# Patient Record
Sex: Female | Born: 1965
Health system: Southern US, Community
[De-identification: ages and names within clinical notes are randomized; demographics above are authoritative.]

## PROBLEM LIST (undated history)

## (undated) DIAGNOSIS — H9313 Tinnitus, bilateral: Secondary | ICD-10-CM

## (undated) DIAGNOSIS — R931 Abnormal findings on diagnostic imaging of heart and coronary circulation: Secondary | ICD-10-CM

## (undated) DIAGNOSIS — E785 Hyperlipidemia, unspecified: Secondary | ICD-10-CM

## (undated) DIAGNOSIS — G609 Hereditary and idiopathic neuropathy, unspecified: Secondary | ICD-10-CM

## (undated) HISTORY — DX: Hyperlipidemia, unspecified: E78.5

## (undated) HISTORY — DX: Tinnitus, bilateral: H93.13

## (undated) HISTORY — DX: Abnormal findings on diagnostic imaging of heart and coronary circulation: R93.1

## (undated) HISTORY — DX: Hereditary and idiopathic neuropathy, unspecified: G60.9

---

## 2000-11-17 ENCOUNTER — Encounter: Payer: Self-pay | Admitting: Family Medicine

## 2000-11-17 ENCOUNTER — Ambulatory Visit (HOSPITAL_COMMUNITY): Admission: RE | Admit: 2000-11-17 | Discharge: 2000-11-17 | Payer: Self-pay | Admitting: Family Medicine

## 2001-08-12 ENCOUNTER — Encounter: Payer: Self-pay | Admitting: Family Medicine

## 2001-08-12 ENCOUNTER — Ambulatory Visit (HOSPITAL_COMMUNITY): Admission: RE | Admit: 2001-08-12 | Discharge: 2001-08-12 | Payer: Self-pay | Admitting: Family Medicine

## 2013-07-12 ENCOUNTER — Telehealth: Payer: Self-pay | Admitting: Nurse Practitioner

## 2013-07-12 DIAGNOSIS — R5381 Other malaise: Secondary | ICD-10-CM

## 2013-07-12 DIAGNOSIS — Z79899 Other long term (current) drug therapy: Secondary | ICD-10-CM

## 2013-07-12 DIAGNOSIS — R5383 Other fatigue: Secondary | ICD-10-CM

## 2013-07-12 DIAGNOSIS — Z0189 Encounter for other specified special examinations: Secondary | ICD-10-CM

## 2013-07-12 NOTE — Telephone Encounter (Signed)
Pt has a PE with pap for 6/30   Can we order BW papers for this please

## 2013-07-12 NOTE — Telephone Encounter (Signed)
Has not been seen in [facility]

## 2013-07-13 NOTE — Telephone Encounter (Signed)
This pts appt is with Eber Mcgroarty, does she need to add BW to this order?  Original note sent as Eber Krus for provider

## 2013-07-13 NOTE — Telephone Encounter (Signed)
Left message notifying patient bloodwork orders are ready.

## 2013-07-13 NOTE — Telephone Encounter (Signed)
CBC, lipid, metabolic 7

## 2013-08-02 ENCOUNTER — Encounter: Payer: Self-pay | Admitting: Nurse Practitioner

## 2013-08-02 ENCOUNTER — Ambulatory Visit (INDEPENDENT_AMBULATORY_CARE_PROVIDER_SITE_OTHER): Payer: BC Managed Care – PPO | Admitting: Nurse Practitioner

## 2013-08-02 VITALS — BP 108/64 | Ht 68.5 in | Wt 132.4 lb

## 2013-08-02 DIAGNOSIS — Z139 Encounter for screening, unspecified: Secondary | ICD-10-CM

## 2013-08-02 DIAGNOSIS — Z1239 Encounter for other screening for malignant neoplasm of breast: Secondary | ICD-10-CM

## 2013-08-02 DIAGNOSIS — Z23 Encounter for immunization: Secondary | ICD-10-CM

## 2013-08-02 DIAGNOSIS — Z0000 Encounter for general adult medical examination without abnormal findings: Secondary | ICD-10-CM

## 2013-08-06 ENCOUNTER — Encounter: Payer: Self-pay | Admitting: Nurse Practitioner

## 2013-08-06 NOTE — Progress Notes (Signed)
   Subjective:    Patient ID: Olivia Johnson, female    DOB: February 23, 1965, 48 y.o.   MRN: [id number]  HPI presents for her wellness checkup. Same sexual partner. Regular menstrual cycles, light flow lasting 5-6 days. Regular vision and dental exams. Regular walking program. Healthy diet. Is due for her routine labs, requesting testing for Addison's disease, her daughter has been diagnosed with this. Patient is currently asymptomatic.    Review of Systems  Constitutional: Negative for fever, activity change, appetite change, fatigue and unexpected weight change.  HENT: Negative for dental problem, ear pain, sinus pressure, sore throat and trouble swallowing.   Respiratory: Negative for cough, chest tightness, shortness of breath and wheezing.   Cardiovascular: Negative for chest pain and leg swelling.  Gastrointestinal: Negative for nausea, vomiting, abdominal pain, diarrhea, constipation, blood in stool and abdominal distention.  Genitourinary: Negative for dysuria, urgency, frequency, vaginal discharge, enuresis, difficulty urinating, genital sores, menstrual problem and pelvic pain.       Objective:   Physical Exam  Vitals reviewed. Constitutional: She is oriented to person, place, and time. She appears well-developed. No distress.  HENT:  Right Ear: External ear normal.  Left Ear: External ear normal.  Mouth/Throat: Oropharynx is clear and moist.  Neck: Normal range of motion. Neck supple. No tracheal deviation present. No thyromegaly present.  Cardiovascular: Normal rate, regular rhythm and normal heart sounds.  Exam reveals no gallop.   No murmur heard. Pulmonary/Chest: Effort normal and breath sounds normal.  Abdominal: Soft. She exhibits no distension. There is no tenderness.  Genitourinary: Vagina normal and uterus normal. No vaginal discharge found.  External GU: No rashes or lesions. Vagina no discharge. Bimanual exam no masses or tenderness noted.  Musculoskeletal: She  exhibits no edema.  Lymphadenopathy:    She has no cervical adenopathy.  Neurological: She is alert and oriented to person, place, and time.  Skin: Skin is warm and dry. No rash noted.  Psychiatric: She has a normal mood and affect. Her behavior is normal.   Breast exam: Slightly dense tissue, no masses, axilla no adenopathy.        Assessment & Plan:  Routine general medical examination at a health care facility - Plan: Hepatic function panel, TSH, Vit D  25 hydroxy (rtn osteoporosis monitoring)  Encounter for special screening examination - Plan: ACTH, Cortisol  Breast screening, unspecified - Plan: MM Digital Screening  Need for prophylactic vaccination with combined diphtheria-tetanus-pertussis (DTP) vaccine - Plan: Tdap vaccine greater than or equal to 7yo IM  Given information for colonoscopy. Next physical in one year.

## 2013-08-10 ENCOUNTER — Ambulatory Visit (HOSPITAL_COMMUNITY)
Admission: RE | Admit: 2013-08-10 | Discharge: 2013-08-10 | Disposition: A | Discharge status: Home / Self Care | Payer: BC Managed Care – PPO | Source: Ambulatory Visit | Attending: Nurse Practitioner | Admitting: Nurse Practitioner

## 2013-08-10 DIAGNOSIS — Z1231 Encounter for screening mammogram for malignant neoplasm of breast: Secondary | ICD-10-CM | POA: Insufficient documentation

## 2014-01-17 ENCOUNTER — Encounter (HOSPITAL_COMMUNITY): Payer: Self-pay | Admitting: Emergency Medicine

## 2014-01-17 ENCOUNTER — Emergency Department (HOSPITAL_COMMUNITY): Payer: Managed Care, Other (non HMO)

## 2014-01-17 ENCOUNTER — Emergency Department (HOSPITAL_COMMUNITY)
Admission: EM | Admit: 2014-01-17 | Discharge: 2014-01-17 | Disposition: A | Discharge status: Home / Self Care | Payer: Managed Care, Other (non HMO) | Attending: Emergency Medicine | Admitting: Emergency Medicine

## 2014-01-17 DIAGNOSIS — Y9389 Activity, other specified: Secondary | ICD-10-CM | POA: Diagnosis not present

## 2014-01-17 DIAGNOSIS — R0602 Shortness of breath: Secondary | ICD-10-CM | POA: Insufficient documentation

## 2014-01-17 DIAGNOSIS — Y998 Other external cause status: Secondary | ICD-10-CM | POA: Diagnosis not present

## 2014-01-17 DIAGNOSIS — W010XXA Fall on same level from slipping, tripping and stumbling without subsequent striking against object, initial encounter: Secondary | ICD-10-CM | POA: Insufficient documentation

## 2014-01-17 DIAGNOSIS — S299XXA Unspecified injury of thorax, initial encounter: Secondary | ICD-10-CM | POA: Diagnosis present

## 2014-01-17 DIAGNOSIS — Z79899 Other long term (current) drug therapy: Secondary | ICD-10-CM | POA: Diagnosis not present

## 2014-01-17 DIAGNOSIS — S2241XA Multiple fractures of ribs, right side, initial encounter for closed fracture: Secondary | ICD-10-CM | POA: Insufficient documentation

## 2014-01-17 DIAGNOSIS — S50311A Abrasion of right elbow, initial encounter: Secondary | ICD-10-CM

## 2014-01-17 DIAGNOSIS — Y92192 Bathroom in other specified residential institution as the place of occurrence of the external cause: Secondary | ICD-10-CM | POA: Diagnosis not present

## 2014-01-17 DIAGNOSIS — W19XXXA Unspecified fall, initial encounter: Secondary | ICD-10-CM

## 2014-01-17 DIAGNOSIS — Z791 Long term (current) use of non-steroidal anti-inflammatories (NSAID): Secondary | ICD-10-CM | POA: Diagnosis not present

## 2014-01-17 MED ORDER — ONDANSETRON 4 MG PO TBDP
4.0000 mg | ORAL_TABLET | Freq: Once | ORAL | Status: AC
  Administered 2014-01-17: 4 mg via ORAL

## 2014-01-17 MED ORDER — ONDANSETRON 4 MG PO TBDP
ORAL_TABLET | ORAL | Status: AC
  Filled 2014-01-17: qty 1

## 2014-01-17 MED ORDER — BACITRACIN ZINC 500 UNIT/GM EX OINT
TOPICAL_OINTMENT | CUTANEOUS | Status: AC
  Filled 2014-01-17: qty 0.9

## 2014-01-17 MED ORDER — FENTANYL CITRATE 0.05 MG/ML IJ SOLN
INTRAMUSCULAR | Status: AC
  Administered 2014-01-17: 100 ug via INTRAVENOUS
  Filled 2014-01-17: qty 2

## 2014-01-17 MED ORDER — ONDANSETRON 4 MG PO TBDP: 4.0000 mg | ORAL_TABLET | Freq: Three times a day (TID) | ORAL | Status: DC | PRN

## 2014-01-17 MED ORDER — FENTANYL CITRATE 0.05 MG/ML IJ SOLN
100.0000 ug | Freq: Once | INTRAMUSCULAR | Status: AC
  Administered 2014-01-17: 100 ug via INTRAVENOUS

## 2014-01-17 MED ORDER — NAPROXEN 500 MG PO TABS
500.0000 mg | ORAL_TABLET | Freq: Two times a day (BID) | ORAL | Status: DC
Start: 2014-01-17 — End: 2014-02-14

## 2014-01-17 MED ORDER — OXYCODONE-ACETAMINOPHEN 5-325 MG PO TABS: 1.0000 | ORAL_TABLET | ORAL | Status: DC | PRN

## 2014-01-17 NOTE — ED Notes (Signed)
Pt states her pain is ok is she is not moving.  Incentive Spirometer given and discussed use.  Pt was able to use IS and pull 1100

## 2014-01-17 NOTE — Discharge Instructions (Signed)
Please call your doctor for a followup appointment within 24-48 hours. When you talk to your doctor please let them know that you were seen in the emergency department and have them acquire all of your records so that they can discuss the findings with you and formulate a treatment plan to fully care for your new and ongoing problems. ° °

## 2014-01-17 NOTE — ED Notes (Signed)
Pt feel from standing position on bathroom since to floor hitting right ribcage on bathtub. Pt c/o pain to right ribcage and sob. Denies neck/back, loc.

## 2014-01-17 NOTE — ED Notes (Signed)
Pt c/o nausea when she went to get up.  Orders received from Dr. Hyacinth MeekerMiller and gave prescription for zofran.

## 2014-01-17 NOTE — ED Provider Notes (Signed)
CSN: [id number]     Arrival date & time 01/17/14  1153 History   First MD Initiated Contact with Patient 01/17/14 1155     Chief Complaint  Patient presents with  . Shortness of Breath     (Consider location/radiation/quality/duration/timing/severity/associated sxs/prior Treatment) HPI  The patient is a 48 year old female, she has no significant past medical history, she had a fall off of a countertop while she was trying to change a light fixture, this occurred just prior to arrival, the symptoms were acute in onset when she struck her right anterolateral lower rib cage on the edge of a bathtub. There was no loss of consciousness, there was acute onset of pain with some difficulty breathing, she denies abdominal pain nausea or vomiting. She was given morphine in route with some improvement  Pain was acute in onset, persistent, severe, associated with shortness of breath, worsened with deep breathing and palpation of the right chest wall  History reviewed. No pertinent past medical history. History reviewed. No pertinent past surgical history. No family history on file. History  Substance Use Topics  . Smoking status: Never Smoker   . Smokeless tobacco: Not on file  . Alcohol Use: Yes     Comment: occasional   OB History    No data available     Review of Systems  Respiratory: Positive for shortness of breath.   Cardiovascular: Positive for chest pain.      Allergies  Review of patient's allergies indicates no known allergies.  Home Medications   Prior to Admission medications   Medication Sig Start Date End Date Taking? Authorizing Provider  5-Hydroxytryptophan (5-HTP PO) Take by mouth daily.    Historical Provider, MD  naproxen (NAPROSYN) 500 MG tablet Take 1 tablet (500 mg total) by mouth 2 (two) times daily with a meal. 01/17/14   Vida RollerBrian D Jensen Kilburg, MD  oxyCODONE-acetaminophen (PERCOCET) 5-325 MG per tablet Take 1 tablet by mouth every 4 (four) hours as needed.  01/17/14   Vida RollerBrian D Laylani Pudwill, MD   BP 113/79 mmHg  Pulse 67  Temp(Src) 98 F (36.7 C) (Oral)  Resp 16  Ht 5\' 7"  (1.702 m)  Wt 130 lb (58.968 kg)  BMI 20.36 kg/m2  SpO2 100%  LMP 01/16/2014 Physical Exam  Constitutional: She appears well-developed and well-nourished. No distress.  HENT:  Head: Normocephalic and atraumatic.  Mouth/Throat: Oropharynx is clear and moist. No oropharyngeal exudate.  Eyes: Conjunctivae and EOM are normal. Pupils are equal, round, and reactive to light. Right eye exhibits no discharge. Left eye exhibits no discharge. No scleral icterus.  Neck: Normal range of motion. Neck supple. No JVD present. No thyromegaly present.  Cardiovascular: Normal rate, regular rhythm, normal heart sounds and intact distal pulses.  Exam reveals no gallop and no friction rub.   No murmur heard. Pulmonary/Chest: Effort normal and breath sounds normal. No respiratory distress. She has no wheezes. She has no rales. She exhibits tenderness.  The patient has no respiratory distress, normal oxygenation but is resistant to taking a deep breath secondary to pain  Abdominal: Soft. Bowel sounds are normal. She exhibits no distension and no mass. There is no tenderness.  No abdominal tenderness, no right upper quadrant tenderness  Musculoskeletal: Normal range of motion. She exhibits no edema or tenderness.  Lymphadenopathy:    She has no cervical adenopathy.  Neurological: She is alert. Coordination normal.  Skin: Skin is warm and dry. No rash noted. No erythema.  Psychiatric: She has a normal mood and  affect. Her behavior is normal.  Nursing note and vitals reviewed.   ED Course  Procedures (including critical care time) Labs Review Labs Reviewed - No data to display  Imaging Review Dg Ribs Unilateral W/chest Right  01/17/2014   CLINICAL DATA:  Fall from standing position hitting ribcage on bathtub, initial encounter  EXAM: RIGHT RIBS AND CHEST - 3+ VIEW  COMPARISON:  None.   FINDINGS: Cardiac shadow is within normal limits. The lungs are well-aerated without focal infiltrate or pneumothorax. There are fractures of the sixth, seventh, eighth and ninth ribs on the right posterior laterally. The seventh and eighth ribs show 1 bone width displacement. No pneumothorax is noted. No other focal abnormality is seen.  IMPRESSION: Multiple rib fractures on the right as described.   Electronically Signed   By: Alcide CleverMark  Lukens M.D.   On: 01/17/2014 12:54      MDM   Final diagnoses:  Fracture of ribs, multiple, closed, right, initial encounter  Abrasion of right elbow, initial encounter    Bedside FAST exam reveals no signs of free intraperitoneal fluid, x-ray ordered to rule out fracture, incentive spirometer ordered, pain medication given including 100 g of fentanyl.  Ultrasound limited abdominal and limited transthoracic ultrasound (FAST)  Indication: Trauma Four views were obtained using the low frequency transducer: Splenorenal, Hepatorenal, Retrovesical, Pericardial subxyphoid Interpretation: No  free fluid visualized surrounding the kidneys, pelvis or pericardium. Images archived electronically Dr. Hyacinth MeekerMiller personally performed and interpreted the images  X-rays confirm for broken ribs, no pneumothorax, no hypoxia, incentive spirometer given, patient stable for discharge on the following medications, fentanyl given here with good improvement, Percocet prescribed, patient informed on how to use the incentive spirometer and the indications for return. She expresses her understanding.  Meds given in ED:  Medications  fentaNYL (SUBLIMAZE) injection 100 mcg (100 mcg Intravenous Given 01/17/14 1221)    New Prescriptions   NAPROXEN (NAPROSYN) 500 MG TABLET    Take 1 tablet (500 mg total) by mouth 2 (two) times daily with a meal.   OXYCODONE-ACETAMINOPHEN (PERCOCET) 5-325 MG PER TABLET    Take 1 tablet by mouth every 4 (four) hours as needed.       Vida RollerBrian D Jacelyn Cuen,  MD 01/17/14 [id number]

## 2014-01-18 ENCOUNTER — Ambulatory Visit (HOSPITAL_COMMUNITY)
Admission: RE | Admit: 2014-01-18 | Discharge: 2014-01-18 | Disposition: A | Discharge status: Home / Self Care | Payer: Managed Care, Other (non HMO) | Source: Ambulatory Visit | Attending: Family Medicine | Admitting: Family Medicine

## 2014-01-18 ENCOUNTER — Ambulatory Visit (INDEPENDENT_AMBULATORY_CARE_PROVIDER_SITE_OTHER): Payer: Managed Care, Other (non HMO) | Admitting: Family Medicine

## 2014-01-18 ENCOUNTER — Telehealth: Payer: Self-pay | Admitting: Family Medicine

## 2014-01-18 VITALS — Temp 98.2°F

## 2014-01-18 DIAGNOSIS — J90 Pleural effusion, not elsewhere classified: Secondary | ICD-10-CM | POA: Insufficient documentation

## 2014-01-18 DIAGNOSIS — R319 Hematuria, unspecified: Secondary | ICD-10-CM

## 2014-01-18 DIAGNOSIS — W1789XA Other fall from one level to another, initial encounter: Secondary | ICD-10-CM | POA: Diagnosis not present

## 2014-01-18 DIAGNOSIS — T797XXA Traumatic subcutaneous emphysema, initial encounter: Secondary | ICD-10-CM | POA: Insufficient documentation

## 2014-01-18 DIAGNOSIS — S2241XA Multiple fractures of ribs, right side, initial encounter for closed fracture: Secondary | ICD-10-CM | POA: Diagnosis not present

## 2014-01-18 DIAGNOSIS — J9811 Atelectasis: Secondary | ICD-10-CM | POA: Insufficient documentation

## 2014-01-18 DIAGNOSIS — Y9289 Other specified places as the place of occurrence of the external cause: Secondary | ICD-10-CM | POA: Diagnosis not present

## 2014-01-18 DIAGNOSIS — R109 Unspecified abdominal pain: Secondary | ICD-10-CM

## 2014-01-18 DIAGNOSIS — R188 Other ascites: Secondary | ICD-10-CM | POA: Insufficient documentation

## 2014-01-18 DIAGNOSIS — S2231XS Fracture of one rib, right side, sequela: Secondary | ICD-10-CM

## 2014-01-18 LAB — BASIC METABOLIC PANEL
BUN: 9 mg/dL (ref 6–23)
CHLORIDE: 92 meq/L — AB (ref 96–112)
CO2: 26 mEq/L (ref 19–32)
Calcium: 9 mg/dL (ref 8.4–10.5)
Creat: 0.7 mg/dL (ref 0.50–1.10)
GLUCOSE: 115 mg/dL — AB (ref 70–99)
Potassium: 4.6 mEq/L (ref 3.5–5.3)
Sodium: 130 mEq/L — ABNORMAL LOW (ref 135–145)

## 2014-01-18 LAB — CBC WITH DIFFERENTIAL/PLATELET
BASOS ABS: 0 10*3/uL (ref 0.0–0.1)
Basophils Relative: 0 % (ref 0–1)
EOS ABS: 0 10*3/uL (ref 0.0–0.7)
Eosinophils Relative: 0 % (ref 0–5)
HEMATOCRIT: 41.1 % (ref 36.0–46.0)
HEMOGLOBIN: 13 g/dL (ref 12.0–15.0)
LYMPHS ABS: 0.8 10*3/uL (ref 0.7–4.0)
Lymphocytes Relative: 5 % — ABNORMAL LOW (ref 12–46)
MCH: 29.5 pg (ref 26.0–34.0)
MCHC: 31.6 g/dL (ref 30.0–36.0)
MCV: 93.4 fL (ref 78.0–100.0)
MPV: 10.3 fL (ref 9.4–12.4)
Monocytes Absolute: 1 10*3/uL (ref 0.1–1.0)
Monocytes Relative: 6 % (ref 3–12)
NEUTROS ABS: 14.9 10*3/uL — AB (ref 1.7–7.7)
NEUTROS PCT: 89 % — AB (ref 43–77)
Platelets: 300 10*3/uL (ref 150–400)
RBC: 4.4 MIL/uL (ref 3.87–5.11)
RDW: 13.7 % (ref 11.5–15.5)
WBC: 16.7 10*3/uL — ABNORMAL HIGH (ref 4.0–10.5)

## 2014-01-18 MED ORDER — OXYCODONE-ACETAMINOPHEN 5-325 MG PO TABS: 1.0000 | ORAL_TABLET | ORAL | Status: DC | PRN

## 2014-01-18 MED ORDER — IOHEXOL 300 MG/ML  SOLN
100.0000 mL | Freq: Once | INTRAMUSCULAR | Status: AC | PRN
  Administered 2014-01-18: 100 mL via INTRAVENOUS

## 2014-01-18 NOTE — Telephone Encounter (Signed)
Pt went to the ER yesterday, broke 4 ribs. Dr Hyacinth MeekerMiller prescribed percocet 1 every 4 hrs. Pt needs refill on this med. Pt will run out on Sat.

## 2014-01-18 NOTE — Progress Notes (Signed)
   Subjective:    Patient ID: Olivia KinJudith S Johnson, female    DOB: 05-31-1965, 48 y.o.   MRN: [id number]  HPI Patient was talked to at length she was on a countertop in the bathroom slipped fell backwards striking her ribs on a counter edge cause severe pain discomfort in addition to that went to the ER they did x-ray showed fractured ribs no pneumothorax after that she took oxycodone for pain she has had some constipation in addition to that she also relates hematuria that started today. ER notes and x-rays reviewed as well. 25 minutes spent with patient  Review of Systems     Objective:   Physical Exam This patient has extensive soreness in her right lower ribs. Lungs are clear hearts regular pulse normal  Her urinalysis shows RBCs and blood, gross hematuria as well although more a day hemosiderin appearance     Assessment & Plan:  Rib fractures-oxycodone every 4 hours when necessary severe pain caution drowsiness try to back off on use as pain improves  Constipation MiraLAX twice daily stool softeners and suppositories as needed  Significant hematuria-in the face of this fracture it is raises serious questions about the possibility of a kidney hematoma or even the possibility of damage to the collecting doubt this patient needs urgent/emergent CT scan with contrast. Radiologist specialist spoke with and agreed upon the testing. Lab work ordered as well. Await the results of these testing.

## 2014-01-18 NOTE — Telephone Encounter (Signed)
Discussed with patient. Patient scheduled follow up office visit for tomm

## 2014-01-18 NOTE — Telephone Encounter (Signed)
Patient got #30 percocet yesterday and states she is taking 6 a day and will be out Sat

## 2014-01-18 NOTE — Telephone Encounter (Signed)
Certainly I am sympathetic. I will need to see her in follow-up. She may be scheduled for tomorrow. We will recheck to see how she is doing. This also establishes us with care of this fractured ribs and allows us to go forward with prescribing her pain medicine.

## 2014-01-19 ENCOUNTER — Ambulatory Visit: Payer: BC Managed Care – PPO | Admitting: Family Medicine

## 2014-01-19 LAB — HEPATIC FUNCTION PANEL
ALT: 13 U/L (ref 0–35)
AST: 26 U/L (ref 0–37)
Albumin: 4.3 g/dL (ref 3.5–5.2)
Alkaline Phosphatase: 49 U/L (ref 39–117)
Bilirubin, Direct: 0.2 mg/dL (ref 0.0–0.3)
Indirect Bilirubin: 0.6 mg/dL (ref 0.2–1.2)
Total Bilirubin: 0.8 mg/dL (ref 0.2–1.2)
Total Protein: 6.7 g/dL (ref 6.0–8.3)

## 2014-01-19 LAB — VITAMIN D 25 HYDROXY (VIT D DEFICIENCY, FRACTURES): Vit D, 25-Hydroxy: 23 ng/mL — ABNORMAL LOW (ref 30–100)

## 2014-01-19 LAB — CORTISOL: Cortisol, Plasma: 24.9 ug/dL

## 2014-01-19 LAB — TSH: TSH: 3.515 u[IU]/mL (ref 0.350–4.500)

## 2014-01-20 LAB — ACTH: C206 ACTH: 7 pg/mL (ref 6–50)

## 2014-01-25 LAB — CBC WITH DIFFERENTIAL/PLATELET
Basophils Absolute: 0.1 10*3/uL (ref 0.0–0.1)
Basophils Relative: 1 % (ref 0–1)
EOS ABS: 0.3 10*3/uL (ref 0.0–0.7)
Eosinophils Relative: 3 % (ref 0–5)
HCT: 36.1 % (ref 36.0–46.0)
HEMOGLOBIN: 11.9 g/dL — AB (ref 12.0–15.0)
Lymphocytes Relative: 25 % (ref 12–46)
Lymphs Abs: 2.1 10*3/uL (ref 0.7–4.0)
MCH: 28.9 pg (ref 26.0–34.0)
MCHC: 33 g/dL (ref 30.0–36.0)
MCV: 87.6 fL (ref 78.0–100.0)
MONO ABS: 0.8 10*3/uL (ref 0.1–1.0)
MPV: 9.2 fL — AB (ref 9.4–12.4)
Monocytes Relative: 9 % (ref 3–12)
Neutro Abs: 5.2 10*3/uL (ref 1.7–7.7)
Neutrophils Relative %: 62 % (ref 43–77)
PLATELETS: 417 10*3/uL — AB (ref 150–400)
RBC: 4.12 MIL/uL (ref 3.87–5.11)
RDW: 13.4 % (ref 11.5–15.5)
WBC: 8.4 10*3/uL (ref 4.0–10.5)

## 2014-01-25 LAB — BASIC METABOLIC PANEL
BUN: 15 mg/dL (ref 6–23)
CALCIUM: 9 mg/dL (ref 8.4–10.5)
CO2: 29 meq/L (ref 19–32)
Chloride: 100 mEq/L (ref 96–112)
Creat: 0.75 mg/dL (ref 0.50–1.10)
GLUCOSE: 95 mg/dL (ref 70–99)
Potassium: 4.4 mEq/L (ref 3.5–5.3)
Sodium: 137 mEq/L (ref 135–145)

## 2014-02-06 ENCOUNTER — Encounter: Payer: Self-pay | Admitting: Family Medicine

## 2014-02-06 ENCOUNTER — Ambulatory Visit (INDEPENDENT_AMBULATORY_CARE_PROVIDER_SITE_OTHER): Payer: Managed Care, Other (non HMO) | Admitting: Family Medicine

## 2014-02-06 VITALS — BP 110/74 | Ht 68.5 in | Wt 135.1 lb

## 2014-02-06 DIAGNOSIS — S2231XS Fracture of one rib, right side, sequela: Secondary | ICD-10-CM

## 2014-02-06 MED ORDER — OXYCODONE-ACETAMINOPHEN 5-325 MG PO TABS: 1.0000 | ORAL_TABLET | ORAL | Status: DC | PRN

## 2014-02-06 NOTE — Progress Notes (Signed)
   Subjective:    Patient ID: Olivia Johnson, female    DOB: 09/24/1965, 49 y.o.   MRN: [id number]  HPI Patient is here today for a follow up visit on her rib fracture. Patient states her symptoms are much better. Currently taking Naproxen and Percocet as needed. Patient states that she has a bruised lung and still experiencing SOB and fatigue due to this. Patient also has a knot still present on her right elbow.   Patient relates she is able to do more than what she used to and she relates a little bit of shortness of breath with activity although she states is actually getting much better compared where was a few weeks back when she first fractured. Review of Systems Patient relates right side pain discomfort with deep breath and with movement of her arm especially above the shoulder    Objective:   Physical Exam Neck no masses right collarbone slightly more prominent than the left but no tenderness probable calcium deposit near the right elbow on her radius Bruising noted on the right side, lungs are clear heart regular       Assessment & Plan:  Rib fracture-use over-the-counter measures during the day to help with inflammation and discomfort gentle range of motion should gradually get better over the next 6-8 weeks often can take 8-12 weeks to get a total resolution oxycodone for severe pain only nighttime use only  I do not find evidence of pneumonia I find no evidence of lung pulmonary embolus. Certainly if the patient worsens over the next few weeks she is notify us and we will do additional imaging

## 2014-02-08 ENCOUNTER — Telehealth: Payer: Self-pay | Admitting: *Deleted

## 2014-02-08 ENCOUNTER — Other Ambulatory Visit: Payer: Self-pay | Admitting: *Deleted

## 2014-02-08 MED ORDER — HYDROCODONE-ACETAMINOPHEN 5-325 MG PO TABS: 1.0000 | ORAL_TABLET | ORAL | Status: DC | PRN

## 2014-02-08 NOTE — Telephone Encounter (Signed)
Discussed with pt. Script ready up front for pickup.

## 2014-02-08 NOTE — Progress Notes (Signed)
Patient notified and verbalized understanding of test results. 

## 2014-02-08 NOTE — Telephone Encounter (Signed)
#  1 please put into the Epic allergy area that oxycodone causes nightmares #2 prescribed hydrocodone 5 mg/325 mg, all #35, 1 every 4 hours when necessary severe pain caution drowsiness if needs another prescription of this please contact our office

## 2014-02-08 NOTE — Telephone Encounter (Signed)
Patient said the percocet she was prescribed is giving her terrible nightmares. She tried taking Tylenol last night, but that did not help with the pain.   Can you please prescribed something else?

## 2014-02-14 ENCOUNTER — Encounter: Payer: Self-pay | Admitting: Family Medicine

## 2014-02-14 ENCOUNTER — Ambulatory Visit (HOSPITAL_COMMUNITY)
Admission: RE | Admit: 2014-02-14 | Discharge: 2014-02-14 | Disposition: A | Discharge status: Home / Self Care | Payer: Managed Care, Other (non HMO) | Source: Ambulatory Visit | Attending: Family Medicine | Admitting: Family Medicine

## 2014-02-14 ENCOUNTER — Ambulatory Visit (INDEPENDENT_AMBULATORY_CARE_PROVIDER_SITE_OTHER): Payer: Managed Care, Other (non HMO) | Admitting: Family Medicine

## 2014-02-14 VITALS — BP 104/68 | Temp 98.4°F | Ht 68.5 in | Wt 132.0 lb

## 2014-02-14 DIAGNOSIS — R0602 Shortness of breath: Secondary | ICD-10-CM | POA: Insufficient documentation

## 2014-02-14 DIAGNOSIS — R0600 Dyspnea, unspecified: Secondary | ICD-10-CM

## 2014-02-14 DIAGNOSIS — S2231XS Fracture of one rib, right side, sequela: Secondary | ICD-10-CM

## 2014-02-14 NOTE — Progress Notes (Signed)
   Subjective:    Patient ID: Olivia Johnson, female    DOB: 04-30-1965, 49 y.o.   MRN: [id number]  HPI Patient is here today for SOB.  She feels like she cannot get a deep breath.  Ever since she fractured her ribs, she feels like she cannot get a deep breath.  Patient will feel light headed, then she will lay down and feel better. O2 sat 99 Does not feel anxious.  No chest pain.    Review of Systems    she does relate low but tingling in the hands and feet and in her face when she feels this unusual breathing sensation she denies any sharp pains other than where she had rib fracture. No hemoptysis. No swelling in the legs. She states for the most part she does not feel short of breath when she moves around it's just when she notices every now and then that she needs to take a deep breath and sometimes feels like she can't quite get a deep breath. Objective:   Physical Exam On physical exam neck no masses lungs are clear no crackles respiratory rate is normal heart is regular pulses normal skin warm dry neurologic gross normal  X-rays were completed shows rib fracture no pneumothorax no fluid.     Assessment & Plan:  I believe some of her symptoms she is having there are neurologic or related to rapid breathing or potentially more shallow breathing compared what she normally does. I do not feel that this is a pulmonary embolism. I told the patient that should gradually get better over the next couple weeks she is to call us back in one week time to give us an update use pain medication only when necessary certainly if she gets a whole lot worse before then she must call us right away. Currently right now I do not feel the patient needs CT MG O.

## 2014-08-17 ENCOUNTER — Telehealth: Payer: Self-pay | Admitting: Family Medicine

## 2014-08-17 NOTE — Telephone Encounter (Signed)
Pt is having a great deal of pain with daily activity on her left foot, She has not hurt or injured it in any way recently. She feels she  Has plantar fascitis and would like a referral or to come see you  As soon as she can.   [facility]   For one week at the end of June she took Ibuprofen BID all week  To help with the discomfort. Rolled her foot over a frozen bottle  Of water to relieve the pain. She sleeps in a brace at night. None of  This seems to be helping her at all.  Please advise

## 2014-08-17 NOTE — Telephone Encounter (Signed)
appt made 7/19

## 2014-08-17 NOTE — Telephone Encounter (Signed)
o v with dr Lorin Picketscott next wk

## 2014-08-22 ENCOUNTER — Ambulatory Visit (INDEPENDENT_AMBULATORY_CARE_PROVIDER_SITE_OTHER): Payer: BLUE CROSS/BLUE SHIELD | Admitting: Family Medicine

## 2014-08-22 ENCOUNTER — Encounter: Payer: Self-pay | Admitting: Family Medicine

## 2014-08-22 VITALS — BP 104/66 | Ht 68.5 in | Wt 136.0 lb

## 2014-08-22 DIAGNOSIS — M722 Plantar fascial fibromatosis: Secondary | ICD-10-CM

## 2014-08-22 MED ORDER — MELOXICAM 7.5 MG PO TABS
7.5000 mg | ORAL_TABLET | Freq: Every day | ORAL | Status: DC
Start: 2014-08-22 — End: 2015-05-04

## 2014-08-22 NOTE — Progress Notes (Signed)
   Subjective:    Patient ID: Olivia Johnson, female    DOB: 1966-01-17, 49 y.o.   MRN: [id number]  Foot Pain This is a recurrent problem. Episode onset: about 2 months ago. She has tried rest, NSAIDs and ice (new shoes) for the symptoms.  left foot pain since May.  Pain daily since then Worse in the am Tight by pm Cut back on walking got a little better Got worse with increased walking  Having some problems with right ribs. Still having some soreness in that area. Had rib fracture in December.   Review of Systems Complains of heel pain for pain.    Objective:   Physical Exam  Plantar fasciitis tenderness on examination and he'll pain with stretching calf normal knee normal      Assessment & Plan:  Plantar fascitis should get better with the treatments to do cold compresses stretching exercises and anti-inflammatory if not doing better in 3-4 weeks notify us and we will set her up with podiatry  Right rib pain getting much better I believe this is just residual from her fracture no need for further x-rays recent x-rays from a few months ago reviewed

## 2014-08-22 NOTE — Patient Instructions (Signed)
Dear Patient,  It has been recommended to you that you have a colonoscopy. It is your responsibility to carry through with this recommendation.   Did you realize that colon cancer is the second leading cancer killer in the [place]. One in every 20 adults will get colon cancer. If all adults would go through the recommended screening for colon cancer (getting a colonoscopy), then there would be a 60% reduction in the number of people dying from colon cancer.  Colon cancer just doesn't come out of the blue. It starts off as a small polyp which over time grows into a cancer. A colonoscopy can prevent cancer and in many cases detected when it is at a very treatable phase. Small colon cancers can have cure rates of 95%. Advanced colon cancer, which often occurs in people who do not do their screenings, have cure rates less than 20%. The risk of colon cancer advances with age. Most adults should have regular colonoscopies every 10 years starting at age 50. This recommendation can vary depending on a person's medical history.  Health-care laws now allow for you to call the gastroenterologist office directly in order to set yourself up for this very important tests. Today we have recommended to you that you do this test. This test may save your life. Failure to do this test puts you at risk for premature death from colon cancer. Do the right thing and schedule this test now.  Here as a list of specialists we recommend in the surrounding area. When you call their office let them know that you are a patient of our practice in your interested in doing a screening colonoscopy. They should assist you without problems. You will need the following information when you called them: 1-name of which Dr. you see, 2-your insurance information, 3-a list of medications that you currently take, 4-any allergies you have to medications.  [facility] Dr. Mike Rourk, Dr Sandi Fields   [facility]enterologist   [phone]  Dr.Najeeb Rehman [facility]   [phone]  [facility] (Dr. Perry, N, Stark, Brodie, Gesner, Jacobs and Pyrtle) [phone]  [facility] (Dr. Buscemi, Edwards, Hayes, Maygod,Outlaw,Schooler) [phone]  Each group of specialists has assured us that when you called them they will help you get your colonoscopy set up. Should you have problems please let us know. Be sure to call soon. Sincerely, Carolyn Hoskins, Dr Steve Izrael Peak, Dr.Courtny Bennison    

## 2015-05-04 ENCOUNTER — Ambulatory Visit (INDEPENDENT_AMBULATORY_CARE_PROVIDER_SITE_OTHER): Payer: BLUE CROSS/BLUE SHIELD | Admitting: Family Medicine

## 2015-05-04 ENCOUNTER — Encounter: Payer: Self-pay | Admitting: Family Medicine

## 2015-05-04 ENCOUNTER — Ambulatory Visit (HOSPITAL_COMMUNITY)
Admission: RE | Admit: 2015-05-04 | Discharge: 2015-05-04 | Disposition: A | Discharge status: Home / Self Care | Payer: BLUE CROSS/BLUE SHIELD | Source: Ambulatory Visit | Attending: Family Medicine | Admitting: Family Medicine

## 2015-05-04 VITALS — BP 116/72 | Ht 68.5 in | Wt 134.0 lb

## 2015-05-04 DIAGNOSIS — M25571 Pain in right ankle and joints of right foot: Secondary | ICD-10-CM

## 2015-05-04 NOTE — Progress Notes (Signed)
   Subjective:    Patient ID: Olivia Johnson, female    DOB: 1965/06/24, 50 y.o.   MRN: [id number]  Ankle Injury  Incident onset: yesterday. The incident occurred at home. The injury mechanism was a fall. The pain is present in the right ankle. She has tried ice and elevation for the symptoms.    Took some ibu solid dose  elev and ice t  Walks daily  No hx of ankle problem   Review of Systems No knee pain no hip pain some swelling last night and this morning    Objective:   Physical Exam  Alert vitals stable blood pressure good lungs clear heart regular in rhythm. Right lateral ankle distinct malleoli are tenderness. Positive soft tissue swelling negative calf pain negative Achilles pain good range of motion ankle      Assessment & Plan:  Impression acute ankle injury. X-rays ordered these are negative likely ankle sprain discussed symptom care and warning signs discussed WSL

## 2015-05-16 ENCOUNTER — Encounter: Payer: Self-pay | Admitting: Family Medicine

## 2015-05-16 ENCOUNTER — Ambulatory Visit (INDEPENDENT_AMBULATORY_CARE_PROVIDER_SITE_OTHER): Payer: BLUE CROSS/BLUE SHIELD | Admitting: Family Medicine

## 2015-05-16 VITALS — BP 116/72 | Ht 68.5 in | Wt 133.0 lb

## 2015-05-16 DIAGNOSIS — S93401D Sprain of unspecified ligament of right ankle, subsequent encounter: Secondary | ICD-10-CM | POA: Diagnosis not present

## 2015-05-16 NOTE — Progress Notes (Signed)
   Subjective:    Patient ID: Olivia KinJudith S Johnson, female    DOB: [birth year], 50 y.o.   MRN: [id number]  Ankle Pain  Incident onset: march 30th. The injury mechanism was a fall. Pain location: right ankle.  seen march 31st and had xray done. Ankle not any better.  Patient had severe ankle sprain 2 weeks ago has not gotten any better tender painful limps when she tries to walk fast hurts to put weight on having take medicine in intermittently denies any calf pain denies any other particular troubles. X-ray reviewed  Review of Systems See above    Objective:   Physical Exam Ankle has some swelling on the lateral aspect and around the inner aspect also has bruising noted. Range of motion is fairly decent. Ligaments appear to be stable. There is moderate to significant tenderness around the area of the right lateral anterior and left posterior region.       Assessment & Plan:  Significant ankle sprain with ligament injury-I believe the patient would benefit from referral to sports orthopedics in addition to that physical therapy. In the meantime may use ibuprofen Tylenol gentle range of motion exercises.

## 2015-05-28 DIAGNOSIS — M25571 Pain in right ankle and joints of right foot: Secondary | ICD-10-CM | POA: Diagnosis not present

## 2015-05-28 DIAGNOSIS — S93411A Sprain of calcaneofibular ligament of right ankle, initial encounter: Secondary | ICD-10-CM | POA: Diagnosis not present

## 2015-06-08 DIAGNOSIS — M25471 Effusion, right ankle: Secondary | ICD-10-CM | POA: Diagnosis not present

## 2015-06-08 DIAGNOSIS — M25571 Pain in right ankle and joints of right foot: Secondary | ICD-10-CM | POA: Diagnosis not present

## 2015-06-14 DIAGNOSIS — S93401A Sprain of unspecified ligament of right ankle, initial encounter: Secondary | ICD-10-CM | POA: Diagnosis not present

## 2015-06-14 DIAGNOSIS — M25571 Pain in right ankle and joints of right foot: Secondary | ICD-10-CM | POA: Diagnosis not present

## 2015-06-22 DIAGNOSIS — S93411A Sprain of calcaneofibular ligament of right ankle, initial encounter: Secondary | ICD-10-CM | POA: Diagnosis not present

## 2015-07-04 DIAGNOSIS — M25571 Pain in right ankle and joints of right foot: Secondary | ICD-10-CM | POA: Diagnosis not present

## 2015-07-04 DIAGNOSIS — R262 Difficulty in walking, not elsewhere classified: Secondary | ICD-10-CM | POA: Diagnosis not present

## 2015-07-04 DIAGNOSIS — M6281 Muscle weakness (generalized): Secondary | ICD-10-CM | POA: Diagnosis not present

## 2015-07-04 DIAGNOSIS — M25671 Stiffness of right ankle, not elsewhere classified: Secondary | ICD-10-CM | POA: Diagnosis not present

## 2015-07-05 DIAGNOSIS — M6281 Muscle weakness (generalized): Secondary | ICD-10-CM | POA: Diagnosis not present

## 2015-07-05 DIAGNOSIS — M25671 Stiffness of right ankle, not elsewhere classified: Secondary | ICD-10-CM | POA: Diagnosis not present

## 2015-07-05 DIAGNOSIS — M25571 Pain in right ankle and joints of right foot: Secondary | ICD-10-CM | POA: Diagnosis not present

## 2015-07-05 DIAGNOSIS — R262 Difficulty in walking, not elsewhere classified: Secondary | ICD-10-CM | POA: Diagnosis not present

## 2015-07-18 DIAGNOSIS — M6281 Muscle weakness (generalized): Secondary | ICD-10-CM | POA: Diagnosis not present

## 2015-07-18 DIAGNOSIS — M25671 Stiffness of right ankle, not elsewhere classified: Secondary | ICD-10-CM | POA: Diagnosis not present

## 2015-07-18 DIAGNOSIS — R262 Difficulty in walking, not elsewhere classified: Secondary | ICD-10-CM | POA: Diagnosis not present

## 2015-07-18 DIAGNOSIS — M25571 Pain in right ankle and joints of right foot: Secondary | ICD-10-CM | POA: Diagnosis not present

## 2015-07-20 DIAGNOSIS — M6281 Muscle weakness (generalized): Secondary | ICD-10-CM | POA: Diagnosis not present

## 2015-07-20 DIAGNOSIS — M25671 Stiffness of right ankle, not elsewhere classified: Secondary | ICD-10-CM | POA: Diagnosis not present

## 2015-07-20 DIAGNOSIS — M25571 Pain in right ankle and joints of right foot: Secondary | ICD-10-CM | POA: Diagnosis not present

## 2015-07-20 DIAGNOSIS — R262 Difficulty in walking, not elsewhere classified: Secondary | ICD-10-CM | POA: Diagnosis not present

## 2015-07-23 DIAGNOSIS — M6281 Muscle weakness (generalized): Secondary | ICD-10-CM | POA: Diagnosis not present

## 2015-07-23 DIAGNOSIS — R262 Difficulty in walking, not elsewhere classified: Secondary | ICD-10-CM | POA: Diagnosis not present

## 2015-07-23 DIAGNOSIS — M25671 Stiffness of right ankle, not elsewhere classified: Secondary | ICD-10-CM | POA: Diagnosis not present

## 2015-07-23 DIAGNOSIS — M25571 Pain in right ankle and joints of right foot: Secondary | ICD-10-CM | POA: Diagnosis not present

## 2015-07-25 DIAGNOSIS — M25671 Stiffness of right ankle, not elsewhere classified: Secondary | ICD-10-CM | POA: Diagnosis not present

## 2015-07-25 DIAGNOSIS — M25571 Pain in right ankle and joints of right foot: Secondary | ICD-10-CM | POA: Diagnosis not present

## 2015-07-25 DIAGNOSIS — M6281 Muscle weakness (generalized): Secondary | ICD-10-CM | POA: Diagnosis not present

## 2015-07-25 DIAGNOSIS — R262 Difficulty in walking, not elsewhere classified: Secondary | ICD-10-CM | POA: Diagnosis not present

## 2015-07-27 DIAGNOSIS — M25671 Stiffness of right ankle, not elsewhere classified: Secondary | ICD-10-CM | POA: Diagnosis not present

## 2015-07-27 DIAGNOSIS — R262 Difficulty in walking, not elsewhere classified: Secondary | ICD-10-CM | POA: Diagnosis not present

## 2015-07-27 DIAGNOSIS — M6281 Muscle weakness (generalized): Secondary | ICD-10-CM | POA: Diagnosis not present

## 2015-07-27 DIAGNOSIS — M25571 Pain in right ankle and joints of right foot: Secondary | ICD-10-CM | POA: Diagnosis not present

## 2015-07-31 DIAGNOSIS — M6281 Muscle weakness (generalized): Secondary | ICD-10-CM | POA: Diagnosis not present

## 2015-07-31 DIAGNOSIS — M25571 Pain in right ankle and joints of right foot: Secondary | ICD-10-CM | POA: Diagnosis not present

## 2015-07-31 DIAGNOSIS — M25671 Stiffness of right ankle, not elsewhere classified: Secondary | ICD-10-CM | POA: Diagnosis not present

## 2015-07-31 DIAGNOSIS — R262 Difficulty in walking, not elsewhere classified: Secondary | ICD-10-CM | POA: Diagnosis not present

## 2015-08-01 DIAGNOSIS — M25671 Stiffness of right ankle, not elsewhere classified: Secondary | ICD-10-CM | POA: Diagnosis not present

## 2015-08-01 DIAGNOSIS — R262 Difficulty in walking, not elsewhere classified: Secondary | ICD-10-CM | POA: Diagnosis not present

## 2015-08-01 DIAGNOSIS — M6281 Muscle weakness (generalized): Secondary | ICD-10-CM | POA: Diagnosis not present

## 2015-08-01 DIAGNOSIS — M25571 Pain in right ankle and joints of right foot: Secondary | ICD-10-CM | POA: Diagnosis not present

## 2015-08-02 DIAGNOSIS — M6281 Muscle weakness (generalized): Secondary | ICD-10-CM | POA: Diagnosis not present

## 2015-08-02 DIAGNOSIS — R262 Difficulty in walking, not elsewhere classified: Secondary | ICD-10-CM | POA: Diagnosis not present

## 2015-08-02 DIAGNOSIS — M25571 Pain in right ankle and joints of right foot: Secondary | ICD-10-CM | POA: Diagnosis not present

## 2015-08-02 DIAGNOSIS — M25671 Stiffness of right ankle, not elsewhere classified: Secondary | ICD-10-CM | POA: Diagnosis not present

## 2015-08-29 DIAGNOSIS — M25571 Pain in right ankle and joints of right foot: Secondary | ICD-10-CM | POA: Diagnosis not present

## 2015-08-29 DIAGNOSIS — M6281 Muscle weakness (generalized): Secondary | ICD-10-CM | POA: Diagnosis not present

## 2015-08-29 DIAGNOSIS — M25671 Stiffness of right ankle, not elsewhere classified: Secondary | ICD-10-CM | POA: Diagnosis not present

## 2015-08-29 DIAGNOSIS — R262 Difficulty in walking, not elsewhere classified: Secondary | ICD-10-CM | POA: Diagnosis not present

## 2015-11-28 ENCOUNTER — Telehealth: Payer: Self-pay | Admitting: Family Medicine

## 2015-11-28 NOTE — Telephone Encounter (Signed)
[place] is going to [place] in about 2 weeks and wants to know if she needs any special vaccinations

## 2015-11-28 NOTE — Telephone Encounter (Signed)
This is not as easy of a question as it may sound. Depending on where the person is going in [place] the patient may need yellow fever vaccine. May also need malaria prophylaxis. In addition to this hepatitis A vaccines is recommended for travel to [place]. Hepatitis B vaccines is optional. Rabies vaccine is recommended for certain cases. The difficult part is it depends on where the person is going in [place] regarding what would be the best advice. There are travel clinics that do these type of consultations but typically it takes weeks to get in with these clinics in the patient is going in 2 weeks. Patient may well need to be [facility] for any necessary vaccines. She is on a short time table. I would be happy to meet with the patient this week as an office visit to discuss in detail or I can give general advice if we know where she is going in [place].(It is easier to do this face-to-face)

## 2015-11-29 NOTE — Telephone Encounter (Signed)
Patient stated she has been to [facility] and got typhoid shot and hep a shot and flu shot. Patient states the yellow fever shot and malaria wasn't recommended for the area she is going to but patient would still like to discuss with dr Lorin PicketScott. Office visit scheduled.

## 2015-11-30 ENCOUNTER — Ambulatory Visit (INDEPENDENT_AMBULATORY_CARE_PROVIDER_SITE_OTHER): Payer: BLUE CROSS/BLUE SHIELD | Admitting: Family Medicine

## 2015-11-30 ENCOUNTER — Encounter: Payer: Self-pay | Admitting: Family Medicine

## 2015-11-30 VITALS — BP 128/80 | Ht 68.5 in | Wt 138.0 lb

## 2015-11-30 DIAGNOSIS — A09 Infectious gastroenteritis and colitis, unspecified: Secondary | ICD-10-CM | POA: Diagnosis not present

## 2015-11-30 MED ORDER — AZITHROMYCIN 250 MG PO TABS: ORAL_TABLET | ORAL | 0 refills | Status: DC

## 2015-11-30 MED ORDER — CIPROFLOXACIN HCL 500 MG PO TABS: ORAL_TABLET | ORAL | 0 refills | Status: DC

## 2015-11-30 MED ORDER — ONDANSETRON HCL 8 MG PO TABS: 8.0000 mg | ORAL_TABLET | Freq: Three times a day (TID) | ORAL | 0 refills | Status: DC | PRN

## 2015-11-30 NOTE — Progress Notes (Signed)
   Subjective:    Patient ID: Olivia Johnson, female    DOB: 03-29-1965, 50 y.o.   MRN: [id number]  HPI Patient in today to discuss going to overseas. Has had some required vaccines.  This patient is coming going to [place]. She is gone be stained in an area that is not within the area that causes malaria or yellow fever. 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 She is here to discuss foreign travel. We went over traveler's diarrhea and the risk of this went over the proper food preparation and what to drink. Discussed with the patient in detail the importance of sticking with a regimented diet while there. We went over the warning signs of traveler's diarrhea including bloody stools severe cramps fevers chills.  Review of Systems    patient currently denies high fever chills sweats vomiting diarrhea. Objective:   Physical Exam  Not indicated 25 minutes spent with patient discussing multiple different questions regarding this issue.      Assessment & Plan:  Traveler's diarrhea-prescription for antibiotics was given. Instruction on using the antibiotic if fever chills cramps or bloody stools. To follow-up if progressive troubles. Warning signs discuss.

## 2015-11-30 NOTE — Addendum Note (Signed)
Addended by: Lilyan PuntLUKING, Lakayla Barrington A on: 11/30/2015 06:04 PM   Modules accepted: Orders

## 2015-12-11 ENCOUNTER — Encounter: Payer: Self-pay | Admitting: Family Medicine

## 2015-12-12 ENCOUNTER — Other Ambulatory Visit: Payer: Self-pay

## 2015-12-12 MED ORDER — ACETAZOLAMIDE 125 MG PO TABS: 125.0000 mg | ORAL_TABLET | Freq: Two times a day (BID) | ORAL | 0 refills | Status: DC

## 2015-12-12 MED ORDER — HYDROXYZINE HCL 25 MG PO TABS: 25.0000 mg | ORAL_TABLET | Freq: Every evening | ORAL | 0 refills | Status: DC | PRN

## 2015-12-12 NOTE — Telephone Encounter (Signed)
Please also discuss this with the patient. Diamox 125 mg 1 twice a day, number 24- for  Mrs. Olivia Johnson. Diamox 125 mg 1 twice a day #24 for Olivia Johnson both of these medications are a twice a day medication typically after 3-4 days a person typically does not have to continue taking this because they have acclimated to the higher elevations. Certainly if significant symptoms of altitude sickness such as severe headaches with vomiting shortness of breath should prompt the person to seek medical care the likelihood of this happening is low

## 2015-12-12 NOTE — Telephone Encounter (Signed)
Notified patient Diamox 125 mg 1 twice a day, number 24- for  Mrs. Olivia Johnson. Diamox 125 mg 1 twice a day #24 for Olivia Johnson both of these medications are a twice a day medication typically after 3-4 days a person typically does not have to continue taking this because they have acclimated to the higher elevations. Certainly if significant symptoms of altitude sickness such as severe headaches with vomiting shortness of breath should prompt the person to seek medical care the likelihood of this happening is low. Patient verbalized understanding and meds sent to pharmacy.

## 2015-12-12 NOTE — Telephone Encounter (Signed)
Nurse's-please call and talk with the patient. This is getting rather complex for back-and-forth messages. Hydroxyzine is antihistamine that can sometimes help with anxiety but can also help with sleep I would not recommend for her daughter to take this with Benadryl. That would be to antihistamines. If her daughter needs allergy medicine she needs a nonsedating allergy medicine such as generic Claritin or generic Allegra. As for the hydroxyzine please talk with Mrs. Olivia Johnson find out if she is wanting a prescription for her or prescription for him. Please converse with me regarding this issue so that we can take care of it without further messaging Thank you

## 2015-12-12 NOTE — Telephone Encounter (Signed)
Notified patient hydroxyzine is antihistamine that can sometimes help with anxiety but can also help with sleep Dr. Lorin PicketScott would not recommend for her daughter to take this with Benadryl. That would be to antihistamines. If her daughter needs allergy medicine she needs a nonsedating allergy medicine such as generic Claritin or generic Allegra. As for the hydroxyzine please talk with Mrs. Olivia Johnson find out if she is wanting a prescription for her or prescription for him. Patient verbalized understanding. Hydroxyzine 25 mg 1 tab qhs prn #30 sent to pharmacy per Dr. Lorin PicketScott.

## 2016-03-06 ENCOUNTER — Encounter: Payer: Self-pay | Admitting: Family Medicine

## 2016-07-08 ENCOUNTER — Encounter: Payer: Self-pay | Admitting: Nurse Practitioner

## 2016-07-08 ENCOUNTER — Ambulatory Visit (INDEPENDENT_AMBULATORY_CARE_PROVIDER_SITE_OTHER): Payer: BLUE CROSS/BLUE SHIELD | Admitting: Nurse Practitioner

## 2016-07-08 VITALS — BP 110/74 | Ht 67.75 in | Wt 138.0 lb

## 2016-07-08 DIAGNOSIS — N912 Amenorrhea, unspecified: Secondary | ICD-10-CM | POA: Diagnosis not present

## 2016-07-08 DIAGNOSIS — Z124 Encounter for screening for malignant neoplasm of cervix: Secondary | ICD-10-CM

## 2016-07-08 DIAGNOSIS — R29890 Loss of height: Secondary | ICD-10-CM | POA: Diagnosis not present

## 2016-07-08 DIAGNOSIS — Z780 Asymptomatic menopausal state: Secondary | ICD-10-CM | POA: Diagnosis not present

## 2016-07-08 DIAGNOSIS — Z8262 Family history of osteoporosis: Secondary | ICD-10-CM | POA: Diagnosis not present

## 2016-07-08 DIAGNOSIS — Z1151 Encounter for screening for human papillomavirus (HPV): Secondary | ICD-10-CM

## 2016-07-08 DIAGNOSIS — Z0000 Encounter for general adult medical examination without abnormal findings: Secondary | ICD-10-CM

## 2016-07-09 ENCOUNTER — Encounter: Payer: Self-pay | Admitting: Nurse Practitioner

## 2016-07-09 NOTE — Progress Notes (Signed)
   Subjective:    Patient ID: Olivia Johnson, female    DOB: 07-Oct-1965, 51 y.o.   MRN: [id number]  HPI presents for her wellness exam. No cycle since November. Same sexual partner. No pelvic pain. Regular vision and dental care. Has had about one inch height loss over time. Very active.     Review of Systems  Constitutional: Negative for activity change, appetite change and fatigue.  HENT: Negative for dental problem, ear pain, sinus pressure and sore throat.   Respiratory: Negative for cough, chest tightness, shortness of breath and wheezing.   Cardiovascular: Negative for chest pain.  Gastrointestinal: Negative for abdominal distention, abdominal pain, constipation, diarrhea, nausea and vomiting.  Genitourinary: Negative for difficulty urinating, dysuria, enuresis, frequency, genital sores, menstrual problem, pelvic pain, urgency, vaginal bleeding and vaginal discharge.       Objective:   Physical Exam  Constitutional: She is oriented to person, place, and time. She appears well-developed. No distress.  HENT:  Right Ear: External ear normal.  Left Ear: External ear normal.  Mouth/Throat: Oropharynx is clear and moist.  Neck: Normal range of motion. Neck supple. No tracheal deviation present. No thyromegaly present.  Cardiovascular: Normal rate, regular rhythm and normal heart sounds.  Exam reveals no gallop.   No murmur heard. Pulmonary/Chest: Effort normal and breath sounds normal.  Abdominal: Soft. She exhibits no distension. There is no tenderness.  Genitourinary: Vagina normal and uterus normal. No vaginal discharge found.  Genitourinary Comments: External GU: no rashes or lesions. Vagina: no discharge. Cervix normal in appearance. No CMT. Bimanual exam: no tenderness or obvious masses.   Musculoskeletal: She exhibits no edema.  Lymphadenopathy:    She has no cervical adenopathy.  Neurological: She is alert and oriented to person, place, and time.  Skin: Skin is warm and  dry. No rash noted.  Psychiatric: She has a normal mood and affect. Her behavior is normal.  Vitals reviewed. Breast exam: no masses; axillae no adenopathy.         Assessment & Plan:  Routine general medical examination at a health care facility - Plan: Pap IG and HPV (high risk) DNA detection, Lipid panel, Hepatic function panel, Basic metabolic panel, VITAMIN D 25 Hydroxy (Vit-D Deficiency, Fractures), FSH, Estradiol  Screening for cervical cancer - Plan: Pap IG and HPV (high risk) DNA detection  Screening for HPV (human papillomavirus) - Plan: Pap IG and HPV (high risk) DNA detection  Amenorrhea - Plan: FSH, Estradiol  Menopause - Plan: FSH, Estradiol  Patient will schedule her own colonoscopy, mammogram and skin cancer exam with dermatologist. To call our office if referral or assistance is needed. Recommend daily vitamin D and calcium supplement.  Return in about 1 year (around 07/08/2017) for physical.

## 2016-07-10 LAB — PAP IG AND HPV HIGH-RISK
HPV, high-risk: NEGATIVE
PAP Smear Comment: 0

## 2016-07-13 ENCOUNTER — Other Ambulatory Visit: Payer: Self-pay | Admitting: Nurse Practitioner

## 2016-07-13 DIAGNOSIS — E559 Vitamin D deficiency, unspecified: Secondary | ICD-10-CM | POA: Insufficient documentation

## 2016-07-13 MED ORDER — VITAMIN D (ERGOCALCIFEROL) 1.25 MG (50000 UNIT) PO CAPS: 50000.0000 [IU] | ORAL_CAPSULE | ORAL | 2 refills | Status: DC

## 2016-07-15 LAB — HEPATIC FUNCTION PANEL
ALT: 14 IU/L (ref 0–32)
AST: 20 IU/L (ref 0–40)
Albumin: 4.7 g/dL (ref 3.5–5.5)
Alkaline Phosphatase: 55 IU/L (ref 39–117)
Bilirubin Total: 0.4 mg/dL (ref 0.0–1.2)
Bilirubin, Direct: 0.1 mg/dL (ref 0.00–0.40)
Total Protein: 6.9 g/dL (ref 6.0–8.5)

## 2016-07-15 LAB — LIPID PANEL
Chol/HDL Ratio: 3.4 ratio (ref 0.0–4.4)
Cholesterol, Total: 192 mg/dL (ref 100–199)
HDL: 56 mg/dL (ref >39)
LDL Calculated: 105 mg/dL — ABNORMAL HIGH (ref 0–99)
Triglycerides: 156 mg/dL — ABNORMAL HIGH (ref 0–149)
VLDL CHOLESTEROL CAL: 31 mg/dL (ref 5–40)

## 2016-07-15 LAB — BASIC METABOLIC PANEL
BUN/Creatinine Ratio: 12 (ref 9–23)
BUN: 10 mg/dL (ref 6–24)
CO2: 25 mmol/L (ref 18–29)
CREATININE: 0.85 mg/dL (ref 0.57–1.00)
Calcium: 9.5 mg/dL (ref 8.7–10.2)
Chloride: 104 mmol/L (ref 96–106)
GFR calc Af Amer: 91 mL/min/{1.73_m2} (ref >59)
GFR calc non Af Amer: 79 mL/min/{1.73_m2} (ref >59)
Glucose: 100 mg/dL — ABNORMAL HIGH (ref 65–99)
Potassium: 4.4 mmol/L (ref 3.5–5.2)
SODIUM: 142 mmol/L (ref 134–144)

## 2016-07-15 LAB — FOLLICLE STIMULATING HORMONE: FSH: 83.2 m[IU]/mL

## 2016-07-15 LAB — ESTRADIOL, FREE
Estradiol, Serum, MS: 12 pg/mL
Free Estradiol, Percent: 2 %
Free Estradiol, Serum: 0.24 pg/mL — ABNORMAL LOW

## 2016-07-15 LAB — VITAMIN D 25 HYDROXY (VIT D DEFICIENCY, FRACTURES): Vit D, 25-Hydroxy: 22.5 ng/mL — ABNORMAL LOW (ref 30.0–100.0)

## 2016-07-27 ENCOUNTER — Encounter: Payer: Self-pay | Admitting: Nurse Practitioner

## 2016-08-14 ENCOUNTER — Encounter: Payer: Self-pay | Admitting: Family Medicine

## 2016-08-14 ENCOUNTER — Ambulatory Visit (INDEPENDENT_AMBULATORY_CARE_PROVIDER_SITE_OTHER): Payer: BLUE CROSS/BLUE SHIELD | Admitting: Family Medicine

## 2016-08-14 VITALS — BP 110/68 | Ht 68.0 in | Wt 137.8 lb

## 2016-08-14 DIAGNOSIS — L989 Disorder of the skin and subcutaneous tissue, unspecified: Secondary | ICD-10-CM

## 2016-08-14 DIAGNOSIS — M7711 Lateral epicondylitis, right elbow: Secondary | ICD-10-CM

## 2016-08-14 DIAGNOSIS — Z1211 Encounter for screening for malignant neoplasm of colon: Secondary | ICD-10-CM | POA: Diagnosis not present

## 2016-08-14 DIAGNOSIS — M21611 Bunion of right foot: Secondary | ICD-10-CM | POA: Diagnosis not present

## 2016-08-14 MED ORDER — MELOXICAM 15 MG PO TABS: 15.0000 mg | ORAL_TABLET | Freq: Every day | ORAL | 1 refills | Status: DC

## 2016-08-14 NOTE — Progress Notes (Signed)
   Subjective:    Patient ID: Ovidio KinJudith S Boerema, female    DOB: [birth year], 51 y.o.   MRN: [id number]  HPI Patient arrives with right elbow pain and left wrist pain-Patient suspects tendonitis.   Patient would also like a mole on her back checked  Patient relates elbow pain discomfort with certain movements. Present over the past few months has not tried anything for movements make it hurt more has not tried any compresses her medications also intermittent left wrist pain denies any injury or problems.  Review of Systems See above    Objective:   Physical Exam  Multiple moles on her arms legs and back one in the mid back has worrisome features of darkness concerning for possibly atypical mole cannot rule out possibility of melanoma Referral to dermatology Tenderness right elbow consistent with lateral epicondylitis Ulnar tenderness consistent with tendinitis      Assessment & Plan:  Mole on the mid back has darkened colors concerning for the possibility of typical changes referral to dermatology  Bunion patient will try to get a tried foot Center if she needs referral she will let us know  Lateral epicondylitis anti-inflammatory brace cold compresses if not better over the next 30-60 days referral to orthopedics  Left wrist and I should gradually get better  Colonoscopy referral

## 2016-08-20 ENCOUNTER — Encounter: Payer: Self-pay | Admitting: Family Medicine

## 2016-08-20 ENCOUNTER — Telehealth: Payer: Self-pay

## 2016-08-20 NOTE — Telephone Encounter (Signed)
[phone]  Patient was told bt pcp, dr. Margo AyeHall, to call and set up her tcs.

## 2016-08-20 NOTE — Telephone Encounter (Signed)
Looks like Dr. Gerda DissLuking just saw her and was making a referral.

## 2016-08-25 ENCOUNTER — Other Ambulatory Visit: Payer: Self-pay

## 2016-08-25 ENCOUNTER — Telehealth: Payer: Self-pay

## 2016-08-25 DIAGNOSIS — Z1211 Encounter for screening for malignant neoplasm of colon: Secondary | ICD-10-CM

## 2016-08-25 NOTE — Telephone Encounter (Signed)
See separate triage.  

## 2016-08-25 NOTE — Telephone Encounter (Signed)
Ok to schedule.

## 2016-08-25 NOTE — Telephone Encounter (Signed)
Gastroenterology Pre-Procedure Review  Request Date: 08/25/2016 Requesting Physician: Dr. Lilyan PuntScott Luking  PATIENT REVIEW QUESTIONS: The patient responded to the following health history questions as indicated:    First colonoscopy  1. Diabetes Melitis: no 2. Joint replacements in the past 12 months: no 3. Major health problems in the past 3 months: no 4. Has an artificial valve or MVP: no 5. Has a defibrillator: no 6. Has been advised in past to take antibiotics in advance of a procedure like teeth cleaning: no 7. Family history of colon cancer: no  8. Alcohol Use: one or two glasses of wine sometimes/ at the most 3 times weekly and some weeks not any 9. History of sleep apnea: no  10. History of coronary artery or other vascular stents placed within the last 12 months: no 11. History of any prior anesthesia complications: no    MEDICATIONS & ALLERGIES:    Patient reports the following regarding taking any blood thinners:   Plavix? no Aspirin? no Coumadin? no Brilinta? no Xarelto? no Eliquis? no Pradaxa? no Savaysa? no Effient? no  Patient confirms/reports the following medications:  Current Outpatient Prescriptions  Medication Sig Dispense Refill  . meloxicam (MOBIC) 15 MG tablet Take 1 tablet (15 mg total) by mouth daily. 30 tablet 1  . Vitamin D, Ergocalciferol, (DRISDOL) 50000 units CAPS capsule Take 50,000 Units by mouth every 7 (seven) days.     No current facility-administered medications for this visit.     Patient confirms/reports the following allergies:  Allergies  Allergen Reactions  . Oxycodone     nightmares    No orders of the defined types were placed in this encounter.   AUTHORIZATION INFORMATION Primary Insurance:   ID #:   Group #:  Pre-Cert / Auth required:  Pre-Cert / Auth #:   Secondary Insurance:   ID #:   Group #:  Pre-Cert / Auth required:  Pre-Cert / Auth #:   SCHEDULE INFORMATION: Procedure has been scheduled as follows:  Date:   09/11/2016          Time: 2:30 pm Location: [facility] Short Stay  This Gastroenterology Pre-Precedure Review Form is being routed to the following provider(s): R. Roetta SessionsMichael Rourk, MD

## 2016-08-26 MED ORDER — NA SULFATE-K SULFATE-MG SULF 17.5-3.13-1.6 GM/177ML PO SOLN
1.0000 | ORAL | 0 refills | Status: DC
Start: 2016-08-26 — End: 2018-07-25

## 2016-08-26 NOTE — Telephone Encounter (Signed)
Rx sent to pharmacy and instructions mailed to pt.  

## 2016-09-01 NOTE — Telephone Encounter (Signed)
NO PA is needed 

## 2016-09-11 ENCOUNTER — Telehealth: Payer: Self-pay

## 2016-09-11 NOTE — Telephone Encounter (Signed)
PT called and apologized, she did not start her prep on time ( did not do the part for last evening). She has rescheduled to 09/17/2016 at 11:30 Am with Dr. Jena Gaussourk. Eber JonesCarolyn in Endo is aware.  Pt is aware of how to do her prep for next week. She knows to do first part on evening before procedure. She will do the 2nd part on day of procedure at 6:30 am and is aware to be NPO after 8:30 am.  She had not opened her prep so it will be good to go.

## 2016-09-17 ENCOUNTER — Encounter (HOSPITAL_COMMUNITY): Payer: Self-pay | Admitting: *Deleted

## 2016-09-17 ENCOUNTER — Ambulatory Visit (HOSPITAL_COMMUNITY)
Admission: RE | Admit: 2016-09-17 | Discharge: 2016-09-17 | Disposition: A | Discharge status: Home / Self Care | Payer: BLUE CROSS/BLUE SHIELD | Source: Ambulatory Visit | Attending: Internal Medicine | Admitting: Internal Medicine

## 2016-09-17 ENCOUNTER — Encounter (HOSPITAL_COMMUNITY)
Admission: RE | Disposition: A | Discharge status: Home / Self Care | Payer: Self-pay | Source: Ambulatory Visit | Attending: Internal Medicine

## 2016-09-17 DIAGNOSIS — Z1211 Encounter for screening for malignant neoplasm of colon: Secondary | ICD-10-CM | POA: Insufficient documentation

## 2016-09-17 DIAGNOSIS — Z79899 Other long term (current) drug therapy: Secondary | ICD-10-CM | POA: Diagnosis not present

## 2016-09-17 DIAGNOSIS — Z885 Allergy status to narcotic agent status: Secondary | ICD-10-CM | POA: Insufficient documentation

## 2016-09-17 DIAGNOSIS — K5730 Diverticulosis of large intestine without perforation or abscess without bleeding: Secondary | ICD-10-CM | POA: Diagnosis not present

## 2016-09-17 DIAGNOSIS — Z1212 Encounter for screening for malignant neoplasm of rectum: Secondary | ICD-10-CM

## 2016-09-17 HISTORY — PX: COLONOSCOPY: SHX5424

## 2016-09-17 SURGERY — COLONOSCOPY
Anesthesia: Moderate Sedation

## 2016-09-17 MED ORDER — MIDAZOLAM HCL 5 MG/5ML IJ SOLN
INTRAMUSCULAR | Status: DC | PRN
  Administered 2016-09-17: 2 mg via INTRAVENOUS
  Administered 2016-09-17: 1 mg via INTRAVENOUS
  Administered 2016-09-17: 2 mg via INTRAVENOUS
  Administered 2016-09-17: 1 mg via INTRAVENOUS

## 2016-09-17 MED ORDER — ONDANSETRON HCL 4 MG/2ML IJ SOLN
INTRAMUSCULAR | Status: DC | PRN
Start: 2016-09-17 — End: 2016-09-17
  Administered 2016-09-17: 4 mg via INTRAVENOUS

## 2016-09-17 MED ORDER — STERILE WATER FOR IRRIGATION IR SOLN
Status: DC | PRN
  Administered 2016-09-17: 11:00:00

## 2016-09-17 MED ORDER — MEPERIDINE HCL 100 MG/ML IJ SOLN
INTRAMUSCULAR | Status: DC | PRN
  Administered 2016-09-17: 50 mg via INTRAVENOUS
  Administered 2016-09-17 (×2): 25 mg via INTRAVENOUS

## 2016-09-17 MED ORDER — SODIUM CHLORIDE 0.9 % IV SOLN
INTRAVENOUS | Status: DC
  Administered 2016-09-17: 1000 mL via INTRAVENOUS

## 2016-09-17 MED ORDER — ONDANSETRON HCL 4 MG/2ML IJ SOLN
INTRAMUSCULAR | Status: AC
  Filled 2016-09-17: qty 2

## 2016-09-17 MED ORDER — MIDAZOLAM HCL 5 MG/5ML IJ SOLN
INTRAMUSCULAR | Status: AC
  Filled 2016-09-17: qty 10

## 2016-09-17 MED ORDER — MEPERIDINE HCL 100 MG/ML IJ SOLN
INTRAMUSCULAR | Status: AC
  Filled 2016-09-17: qty 2

## 2016-09-17 NOTE — H&P (Signed)
@  KUVJ@   Primary Care Physician:  Kathyrn Drown, MD Primary Gastroenterologist:  Dr. Gala Romney  Pre-Procedure History & Physical: HPI:  Olivia Johnson is a 51 y.o. female is here for a screening colonoscopy.  No bowel symptoms. No family history of colon cancer. No prior colonoscopy.   History reviewed. No pertinent past medical history.  History reviewed. No pertinent surgical history.  Prior to Admission medications   Medication Sig Start Date End Date Taking? Authorizing Provider  meloxicam (MOBIC) 15 MG tablet Take 1 tablet (15 mg total) by mouth daily. 08/14/16  Yes Luking, Elayne Snare, MD  Na Sulfate-K Sulfate-Mg Sulf (SUPREP BOWEL PREP KIT) 17.5-3.13-1.6 GM/180ML SOLN Take 1 kit by mouth as directed. 08/26/16  Yes Daneil Dolin, MD    Allergies as of 08/25/2016 - Review Complete 08/25/2016  Allergen Reaction Noted  . Oxycodone  02/08/2014    History reviewed. No pertinent family history.  Social History   Social History  . Marital status: Married    Spouse name: N/A  . Number of children: N/A  . Years of education: N/A   Occupational History  . Not on file.   Social History Main Topics  . Smoking status: Never Smoker  . Smokeless tobacco: Never Used  . Alcohol use Yes     Comment: occasional  . Drug use: No  . Sexual activity: Not on file   Other Topics Concern  . Not on file   Social History Narrative  . No narrative on file    Review of Systems: See HPI, otherwise negative ROS  Physical Exam: BP 121/74   Pulse 75   Temp 98.1 F (36.7 C) (Oral)   Resp 11   Ht 5' 7.5" (1.715 m)   Wt 133 lb (60.3 kg)   LMP 04/20/2015   SpO2 100%   BMI 20.52 kg/m  General:   Alert,  Well-developed, well-nourished, pleasant and cooperative in NAD Lungs:  Clear throughout to auscultation.   No wheezes, crackles, or rhonchi. No acute distress. Heart:  Regular rate and rhythm; no murmurs, clicks, rubs,  or gallops. Abdomen:  Soft, nontender and nondistended. No masses,  hepatosplenomegaly or hernias noted. Normal bowel sounds, without guarding, and without rebound.    Impression/Plan: Olivia Johnson is now here to undergo a screening colonoscopy.  First ever average risk screening examination.  Risks, benefits, limitations, imponderables and alternatives regarding colonoscopy have been reviewed with the patient. Questions have been answered. All parties agreeable.     Notice:  This dictation was prepared with Dragon dictation along with smaller phrase technology. Any transcriptional errors that result from this process are unintentional and may not be corrected upon review.

## 2016-09-17 NOTE — Op Note (Signed)
[facility] Patient Name: Olivia Johnson Procedure Date: 09/17/2016 10:43 AM MRN: [id number] Date of Birth: 12/12/1965 Attending MD: Gennette Pac , MD CSN: [id number] Age: 51 Admit Type: Outpatient Procedure:                Colonoscopy Indications:              Screening for colorectal malignant neoplasm Providers:                Gennette Pac, MD, Loma Messing B. Mathis Fare RN, RN,                            Dyann Ruddle Referring MD:              Medicines:                Midazolam 6 mg IV, Meperidine 100 mg IV,                            Ondansetron 4 mg IV Complications:            No immediate complications. Estimated Blood Loss:     Estimated blood loss: none. Procedure:                Pre-Anesthesia Assessment:                           - Prior to the procedure, a History and Physical                            was performed, and patient medications and                            allergies were reviewed. The patient's tolerance of                            previous anesthesia was also reviewed. The risks                            and benefits of the procedure and the sedation                            options and risks were discussed with the patient.                            All questions were answered, and informed consent                            was obtained. Prior Anticoagulants: The patient has                            taken no previous anticoagulant or antiplatelet                            agents. ASA Grade Assessment: II - A patient with  mild systemic disease. After reviewing the risks                            and benefits, the patient was deemed in                            satisfactory condition to undergo the procedure.                           After obtaining informed consent, the colonoscope                            was passed under direct vision. Throughout the                            procedure, the patient's  blood pressure, pulse, and                            oxygen saturations were monitored continuously. The                            EC-[id number] ([id number] scope was introduced through                            the anus and advanced to the 3 cm into the ileum.                            The terminal ileum, ileocecal valve, appendiceal                            orifice, and rectum were photographed. The entire                            colon was well visualized. The patient tolerated                            the procedure well. The quality of the bowel                            preparation was adequate. Scope In: 11:15:14 AM Scope Out: 11:28:53 AM Scope Withdrawal Time: 0 hours 6 minutes 53 seconds  Total Procedure Duration: 0 hours 13 minutes 39 seconds  Findings:      The perianal and digital rectal examinations were normal.      Scattered small and large-mouthed diverticula were found in the sigmoid       colon, descending colon and transverse colon.      The exam was otherwise without abnormality on direct and retroflexion       views. Impression:               - Diverticulosis in the sigmoid colon, in the                            descending colon and in the transverse colon.                           -  The examination was otherwise normal on direct                            and retroflexion views.                           - No specimens collected. Moderate Sedation:      Moderate (conscious) sedation was administered by the endoscopy nurse       and supervised by the endoscopist. The following parameters were       monitored: oxygen saturation, heart rate, blood pressure, respiratory       rate, EKG, adequacy of pulmonary ventilation, and response to care.       Total physician intraservice time was 22 minutes. Recommendation:           - Patient has a contact number available for                            emergencies. The signs and symptoms of potential                             delayed complications were discussed with the                            patient. Return to normal activities tomorrow.                            Written discharge instructions were provided to the                            patient.                           - Resume previous diet.                           - Continue present medications.                           - Repeat colonoscopy in 10 years for screening                            purposes.                           - Return to GI clinic PRN. Procedure Code(s):        --- Professional ---                           [phone], Colonoscopy, flexible; diagnostic, including                            collection of specimen(s) by brushing or washing,                            when performed (separate procedure)  16109, Moderate sedation services provided by the                            same physician or other qualified health care                            professional performing the diagnostic or                            therapeutic service that the sedation supports,                            requiring the presence of an independent trained                            observer to assist in the monitoring of the                            patient's level of consciousness and physiological                            status; initial 15 minutes of intraservice time,                            patient age 26 years or older Diagnosis Code(s):        --- Professional ---                           Z12.11, Encounter for screening for malignant                            neoplasm of colon                           K57.30, Diverticulosis of large intestine without                            perforation or abscess without bleeding CPT copyright 2016 [employer]. All rights reserved. The codes documented in this report are preliminary and upon coder review may  be revised to meet current  compliance requirements. Gerrit Friends. Meaghann Choo, MD Gennette Pac, MD 09/17/2016 11:40:09 AM This report has been signed electronically. Number of Addenda: 0

## 2016-09-17 NOTE — Discharge Instructions (Addendum)
Diverticulosis °Diverticulosis is a condition that develops when small pouches (diverticula) form in the wall of the large intestine (colon). The colon is where water is absorbed and stool is formed. The pouches form when the inside layer of the colon pushes through weak spots in the outer layers of the colon. You may have a few pouches or many of them. °What are the causes? °The cause of this condition is not known. °What increases the risk? °The following factors may make you more likely to develop this condition: °· Being older than age 60. Your risk for this condition increases with age. Diverticulosis is rare among people younger than age 30. By age 80, many people have it. °· Eating a low-fiber diet. °· Having frequent constipation. °· Being overweight. °· Not getting enough exercise. °· Smoking. °· Taking over-the-counter pain medicines, like aspirin and ibuprofen. °· Having a family history of diverticulosis. ° °What are the signs or symptoms? °In most people, there are no symptoms of this condition. If you do have symptoms, they may include: °· Bloating. °· Cramps in the abdomen. °· Constipation or diarrhea. °· Pain in the lower left side of the abdomen. ° °How is this diagnosed? °This condition is most often diagnosed during an exam for other colon problems. Because diverticulosis usually has no symptoms, it often cannot be diagnosed independently. This condition may be diagnosed by: °· Using a flexible scope to examine the colon (colonoscopy). °· Taking an X-ray of the colon after dye has been put into the colon (barium enema). °· Doing a CT scan. ° °How is this treated? °You may not need treatment for this condition if you have never developed an infection related to diverticulosis. If you have had an infection before, treatment may include: °· Eating a high-fiber diet. This may include eating more fruits, vegetables, and grains. °· Taking a fiber supplement. °· Taking a live bacteria supplement  (probiotic). °· Taking medicine to relax your colon. °· Taking antibiotic medicines. ° °Follow these instructions at home: °· Drink 6-8 glasses of water or more each day to prevent constipation. °· Try not to strain when you have a bowel movement. °· If you have had an infection before: °? Eat more fiber as directed by your health care provider or your diet and nutrition specialist (dietitian). °? Take a fiber supplement or probiotic, if your health care provider approves. °· Take over-the-counter and prescription medicines only as told by your health care provider. °· If you were prescribed an antibiotic, take it as told by your health care provider. Do not stop taking the antibiotic even if you start to feel better. °· Keep all follow-up visits as told by your health care provider. This is important. °Contact a health care provider if: °· You have pain in your abdomen. °· You have bloating. °· You have cramps. °· You have not had a bowel movement in 3 days. °Get help right away if: °· Your pain gets worse. °· Your bloating becomes very bad. °· You have a fever or chills, and your symptoms suddenly get worse. °· You vomit. °· You have bowel movements that are bloody or black. °· You have bleeding from your rectum. °Summary °· Diverticulosis is a condition that develops when small pouches (diverticula) form in the wall of the large intestine (colon). °· You may have a few pouches or many of them. °· This condition is most often diagnosed during an exam for other colon problems. °· If you have had an   infection related to diverticulosis, treatment may include increasing the fiber in your diet, taking supplements, or taking medicines. °This information is not intended to replace advice given to you by your health care provider. Make sure you discuss any questions you have with your health care provider. °Document Released: 10/18/2003 Document Revised: 12/10/2015 Document Reviewed: 12/10/2015 °Elsevier Interactive  Patient Education © 2017 [employer] ° °Colonoscopy °Discharge Instructions ° °Read the instructions outlined below and refer to this sheet in the next few weeks. These discharge instructions provide you with general information on caring for yourself after you leave the hospital. Your doctor may also give you specific instructions. While your treatment has been planned according to the most current medical practices available, unavoidable complications occasionally occur. If you have any problems or questions after discharge, call Dr. Rourk at [phone]. °ACTIVITY °· You may resume your regular activity, but move at a slower pace for the next 24 hours.  °· Take frequent rest periods for the next 24 hours.  °· Walking will help get rid of the air and reduce the bloated feeling in your belly (abdomen).  °· No driving for 24 hours (because of the medicine (anesthesia) used during the test).   °· Do not sign any important legal documents or operate any machinery for 24 hours (because of the anesthesia used during the test).  °NUTRITION °· Drink plenty of fluids.  °· You may resume your normal diet as instructed by your doctor.  °· Begin with a light meal and progress to your normal diet. Heavy or fried foods are harder to digest and may make you feel sick to your stomach (nauseated).  °· Avoid alcoholic beverages for 24 hours or as instructed.  °MEDICATIONS °· You may resume your normal medications unless your doctor tells you otherwise.  °WHAT YOU CAN EXPECT TODAY °· Some feelings of bloating in the abdomen.  °· Passage of more gas than usual.  °· Spotting of blood in your stool or on the toilet paper.  °IF YOU HAD POLYPS REMOVED DURING THE COLONOSCOPY: °· No aspirin products for 7 days or as instructed.  °· No alcohol for 7 days or as instructed.  °· Eat a soft diet for the next 24 hours.  °FINDING OUT THE RESULTS OF YOUR TEST °Not all test results are available during your visit. If your test results are not back  during the visit, make an appointment with your caregiver to find out the results. Do not assume everything is normal if you have not heard from your caregiver or the medical facility. It is important for you to follow up on all of your test results.  °SEEK IMMEDIATE MEDICAL ATTENTION IF: °· You have more than a spotting of blood in your stool.  °· Your belly is swollen (abdominal distention).  °· You are nauseated or vomiting.  °· You have a temperature over 101.  °· You have abdominal pain or discomfort that is severe or gets worse throughout the day.  ° ° °Diverticulosis information provided ° °Repeat screening colonoscopy in 10 years °

## 2016-09-18 DIAGNOSIS — Z1283 Encounter for screening for malignant neoplasm of skin: Secondary | ICD-10-CM | POA: Diagnosis not present

## 2016-09-18 DIAGNOSIS — D225 Melanocytic nevi of trunk: Secondary | ICD-10-CM | POA: Diagnosis not present

## 2016-09-18 DIAGNOSIS — D485 Neoplasm of uncertain behavior of skin: Secondary | ICD-10-CM | POA: Diagnosis not present

## 2016-09-18 DIAGNOSIS — B078 Other viral warts: Secondary | ICD-10-CM | POA: Diagnosis not present

## 2016-09-19 ENCOUNTER — Encounter (HOSPITAL_COMMUNITY): Payer: Self-pay | Admitting: Internal Medicine

## 2016-10-10 ENCOUNTER — Encounter: Payer: Self-pay | Admitting: Family Medicine

## 2016-10-13 NOTE — Telephone Encounter (Signed)
Please let the patient know that essentially with her elbow she has done everything correct but at this point would benefit from being seen by orthopedics unless patient has one she would like to see [facility] also podiatry referral to Dr. Nolen MuMcKinney because of bunion

## 2016-10-14 ENCOUNTER — Other Ambulatory Visit: Payer: Self-pay

## 2016-10-14 DIAGNOSIS — M21619 Bunion of unspecified foot: Secondary | ICD-10-CM

## 2016-10-14 DIAGNOSIS — M778 Other enthesopathies, not elsewhere classified: Secondary | ICD-10-CM

## 2016-10-14 NOTE — Telephone Encounter (Signed)
Patient aware we have made these referrals.

## 2016-10-15 NOTE — Progress Notes (Signed)
Patient is aware of referrals

## 2016-10-15 NOTE — Progress Notes (Signed)
?   Unsure of the message?

## 2016-10-20 ENCOUNTER — Encounter: Payer: Self-pay | Admitting: Family Medicine

## 2016-10-23 DIAGNOSIS — M7711 Lateral epicondylitis, right elbow: Secondary | ICD-10-CM | POA: Diagnosis not present

## 2016-10-27 DIAGNOSIS — M79674 Pain in right toe(s): Secondary | ICD-10-CM | POA: Diagnosis not present

## 2016-10-27 DIAGNOSIS — M2011 Hallux valgus (acquired), right foot: Secondary | ICD-10-CM | POA: Diagnosis not present

## 2016-12-08 ENCOUNTER — Other Ambulatory Visit: Payer: Self-pay | Admitting: Family Medicine

## 2018-06-27 ENCOUNTER — Encounter: Payer: Self-pay | Admitting: Family Medicine

## 2018-06-29 ENCOUNTER — Other Ambulatory Visit: Payer: Self-pay | Admitting: *Deleted

## 2018-06-29 MED ORDER — CEPHALEXIN 500 MG PO CAPS: 500.0000 mg | ORAL_CAPSULE | Freq: Four times a day (QID) | ORAL | 0 refills | Status: DC

## 2018-06-29 NOTE — Telephone Encounter (Signed)
Called pt and discussed with her. Keflex sent to [facility] and pt not able to come in today for tetanus states she will call us tomorrow for an appt. She has someone coming out to house in the morning and will call to schedule after repair man leaves her house.

## 2018-06-29 NOTE — Telephone Encounter (Signed)
Nurses I would recommend a tetanus shot- if possible to do tetanus shot today if not then please do tomorrow morning Please call her try to get it done today I would also recommend a short course of Keflex 500 mg 4 times daily for 7 days If progressive troubles with the puncture wound we will need to see her

## 2018-07-25 ENCOUNTER — Other Ambulatory Visit: Payer: Self-pay

## 2018-07-25 ENCOUNTER — Encounter (HOSPITAL_COMMUNITY): Payer: Self-pay | Admitting: Emergency Medicine

## 2018-07-25 ENCOUNTER — Emergency Department (HOSPITAL_COMMUNITY)
Admission: EM | Admit: 2018-07-25 | Discharge: 2018-07-25 | Disposition: A | Discharge status: Home / Self Care | Payer: BC Managed Care – PPO | Attending: Emergency Medicine | Admitting: Emergency Medicine

## 2018-07-25 DIAGNOSIS — W57XXXA Bitten or stung by nonvenomous insect and other nonvenomous arthropods, initial encounter: Secondary | ICD-10-CM | POA: Diagnosis not present

## 2018-07-25 DIAGNOSIS — L819 Disorder of pigmentation, unspecified: Secondary | ICD-10-CM | POA: Diagnosis not present

## 2018-07-25 DIAGNOSIS — Y939 Activity, unspecified: Secondary | ICD-10-CM | POA: Insufficient documentation

## 2018-07-25 DIAGNOSIS — Z23 Encounter for immunization: Secondary | ICD-10-CM | POA: Insufficient documentation

## 2018-07-25 DIAGNOSIS — Y999 Unspecified external cause status: Secondary | ICD-10-CM | POA: Diagnosis not present

## 2018-07-25 DIAGNOSIS — Y929 Unspecified place or not applicable: Secondary | ICD-10-CM | POA: Diagnosis not present

## 2018-07-25 DIAGNOSIS — S90569A Insect bite (nonvenomous), unspecified ankle, initial encounter: Secondary | ICD-10-CM | POA: Insufficient documentation

## 2018-07-25 MED ORDER — TETANUS-DIPHTH-ACELL PERTUSSIS 5-2.5-18.5 LF-MCG/0.5 IM SUSP
0.5000 mL | Freq: Once | INTRAMUSCULAR | Status: AC
  Administered 2018-07-25: 0.5 mL via INTRAMUSCULAR
  Filled 2018-07-25: qty 0.5

## 2018-07-25 NOTE — ED Provider Notes (Signed)
[facility] EMERGENCY DEPARTMENT Provider Note   CSN: [id number] Arrival date & time: 07/25/18  1400    History   Chief Complaint Chief Complaint  Patient presents with  . Tick Removal  . Tick Bite    HPI Olivia Johnson is a 53 y.o. female.     53 yo F with a chief complaint of tick bite.  Patient found a very small tick that was on her ankle this morning after going hiking yesterday.  She removed it and then was concerned that there may be more ticks and started looking about her body.  She found 3 bumps that she had never noticed on the inside of her vagina.  She also has been told by her family doctor a few months back that she will get her tetanus updated after sustaining a wound from a nail in her house when they were doing some renovations.  She denies rash denies arthralgias denies fever.  The history is provided by the patient.  Illness Severity:  Mild Onset quality:  Gradual Duration:  1 day Timing:  Constant Progression:  Unchanged Chronicity:  New Associated symptoms: no chest pain, no congestion, no fever, no headaches, no myalgias, no nausea, no rhinorrhea, no shortness of breath, no vomiting and no wheezing     History reviewed. No pertinent past medical history.  Patient Active Problem List   Diagnosis Date Noted  . Vitamin D insufficiency 07/13/2016    Past Surgical History:  Procedure Laterality Date  . COLONOSCOPY N/A 09/17/2016   Procedure: COLONOSCOPY;  Surgeon: Corbin Adeourk, Robert M, MD;  Location: [facility];  Service: Endoscopy;  Laterality: N/A;  2:30 pm     OB History   No obstetric history on file.      Home Medications    Prior to Admission medications   Medication Sig Start Date End Date Taking? Authorizing Provider  cyclobenzaprine (FLEXERIL) 10 MG tablet Take 1 tablet by mouth 3 (three) times daily as needed. 02/12/18   [provider]  hydrOXYzine (ATARAX/VISTARIL) 25 MG tablet TAKE (1) TABLET BY MOUTH AT BEDTIME. 12/08/16    Babs SciaraLuking, Scott A, MD  meloxicam (MOBIC) 15 MG tablet Take 1 tablet (15 mg total) by mouth daily. 08/14/16   Babs SciaraLuking, Scott A, MD    Family History History reviewed. No pertinent family history.  Social History Social History   Tobacco Use  . Smoking status: Never Smoker  . Smokeless tobacco: Never Used  Substance Use Topics  . Alcohol use: Yes    Comment: occasional  . Drug use: No     Allergies   Oxycodone   Review of Systems Review of Systems  Constitutional: Negative for chills and fever.  HENT: Negative for congestion and rhinorrhea.   Eyes: Negative for redness and visual disturbance.  Respiratory: Negative for shortness of breath and wheezing.   Cardiovascular: Negative for chest pain and palpitations.  Gastrointestinal: Negative for nausea and vomiting.  Genitourinary: Negative for dysuria and urgency.  Musculoskeletal: Negative for arthralgias and myalgias.  Skin: Negative for pallor and wound.  Neurological: Negative for dizziness and headaches.     Physical Exam Updated Vital Signs BP 139/84 (BP Location: Left Arm)   Pulse (!) 102   Temp 98.7 F (37.1 C) (Oral)   Ht 5\' 8"  (1.727 m)   Wt 56.7 kg   LMP 04/20/2015   SpO2 100%   BMI 19.01 kg/m   Physical Exam Vitals signs and nursing note reviewed.  Constitutional:  General: She is not in acute distress.    Appearance: She is well-developed. She is not diaphoretic.  HENT:     Head: Normocephalic and atraumatic.  Eyes:     Pupils: Pupils are equal, round, and reactive to light.  Neck:     Musculoskeletal: Normal range of motion and neck supple.  Cardiovascular:     Rate and Rhythm: Normal rate and regular rhythm.     Heart sounds: No murmur. No friction rub. No gallop.   Pulmonary:     Effort: Pulmonary effort is normal.     Breath sounds: No wheezing or rales.  Abdominal:     General: There is no distension.     Palpations: Abdomen is soft.     Tenderness: There is no abdominal  tenderness.  Genitourinary:    Comments: Hyperpigmentation to the vaginal wall just at the introitus to the 9:00 and 6 o'clock position.  No appreciable foreign body or insect. Musculoskeletal:        General: No tenderness.  Skin:    General: Skin is warm and dry.  Neurological:     Mental Status: She is alert and oriented to person, place, and time.  Psychiatric:        Behavior: Behavior normal.      ED Treatments / Results  Labs (all labs ordered are listed, but only abnormal results are displayed) Labs Reviewed - No data to display  EKG None  Radiology No results found.  Procedures Procedures (including critical care time)  Medications Ordered in ED Medications  Tdap (BOOSTRIX) injection 0.5 mL (has no administration in time range)     Initial Impression / Assessment and Plan / ED Course  I have reviewed the triage vital signs and the nursing notes.  Pertinent labs & imaging results that were available during my care of the patient were reviewed by me and considered in my medical decision making (see chart for details).        53 yo F with a cc of a tick bite.  She is most concerned about some spots that she still in her vagina that she had not noticed previously.  When she was looking for ticks she was using a mirror and noted some hyperpigmented areas.  These are flattened not palpable, likely just localized area of pigment.  We will update her tetanus PCP follow-up.  2:32 PM:  I have discussed the diagnosis/risks/treatment options with the patient and believe the pt to be eligible for discharge home to follow-up with PCP. We also discussed returning to the ED immediately if new or worsening sx occur. We discussed the sx which are most concerning (e.g., sudden worsening pain, fever, inability to tolerate by mouth) that necessitate immediate return. Medications administered to the patient during their visit and any new prescriptions provided to the patient are  listed below.  Medications given during this visit Medications  Tdap (BOOSTRIX) injection 0.5 mL (has no administration in time range)     The patient appears reasonably screen and/or stabilized for discharge and I doubt any other medical condition or other [facility] requiring further screening, evaluation, or treatment in the ED at this time prior to discharge.    Final Clinical Impressions(s) / ED Diagnoses   Final diagnoses:  Hyperpigmentation  Tick bite, initial encounter  Need for diphtheria-tetanus-pertussis (Tdap) vaccine    ED Discharge Orders    None       Deno Etienne, DO 07/25/18 1432

## 2018-07-25 NOTE — ED Triage Notes (Signed)
Tick removed this am after hiking yesterday.  Pt reports tick removed at end of May.  Told by PCP to get T-dap, last T-dap was 2015 d/t puncture wound in May.

## 2018-11-18 ENCOUNTER — Other Ambulatory Visit: Payer: Self-pay

## 2018-11-18 ENCOUNTER — Ambulatory Visit (INDEPENDENT_AMBULATORY_CARE_PROVIDER_SITE_OTHER): Payer: BC Managed Care – PPO | Admitting: Family Medicine

## 2018-11-18 DIAGNOSIS — H00011 Hordeolum externum right upper eyelid: Secondary | ICD-10-CM

## 2018-11-18 MED ORDER — GENTAMICIN SULFATE 0.3 % OP SOLN: OPHTHALMIC | 1 refills | Status: DC

## 2018-11-18 NOTE — Progress Notes (Signed)
   Subjective:  Audio plus video  Patient ID: Olivia Johnson, female    DOB: 1965/12/30, 53 y.o.   MRN: [id number]  HPIright eye lid is red and swollen. Started 4 days ago this time but its the 5th one she had this year. Sometimes it is the left eye instead of the right eye.  Has not tried any treatments.   Virtual Visit via Video Note  I connected with Olivia Johnson on 11/18/18 at 11:00 AM EDT by a video enabled telemedicine application and verified that I am speaking with the correct person using two identifiers.  Location: Patient: home Provider: office   I discussed the limitations of evaluation and management by telemedicine and the availability of in person appointments. The patient expressed understanding and agreed to proceed.  History of Present Illness:    Observations/Objective:   Assessment and Plan:   Follow Up Instructions:    I discussed the assessment and treatment plan with the patient. The patient was provided an opportunity to ask questions and all were answered. The patient agreed with the plan and demonstrated an understanding of the instructions.   The patient was advised to call back or seek an in-person evaluation if the symptoms worsen or if the condition fails to improve as anticipated.  I provided 17 minutes of non-face-to-face time during this encounter.  Patient has been struggling with intermittent styes all of this year.  Her current one is the most challenging.  Right upper eyelid.  Occurred 4 to 5 days ago.  Swollen essential tender nodule.  Pointing somewhat underneath the eyelid.  Now has developed substantial periorbital edema.  Worse when waking in the morning.  Accompanied by crustiness.  No fever no chills    Review of Systems     Objective:   Physical Exam Virtual exam reveals periorbital edema and and pointing of the 2 stye in the upper eyelid  Impression recurrent stye.  This is bad enough to initiate antibiotic eyedrops.   Rationale discussed.  Also once he does consider utilizing baby shampoo to wash of the eyes daily to reduce bacterial count in the eyelashes.  If continues to recur recommend she speak with her eye doctor multiple questions answered         Assessment & Plan:

## 2019-03-14 ENCOUNTER — Other Ambulatory Visit (HOSPITAL_COMMUNITY): Payer: Self-pay | Admitting: Family Medicine

## 2019-03-14 ENCOUNTER — Encounter: Payer: Self-pay | Admitting: Nurse Practitioner

## 2019-03-14 ENCOUNTER — Telehealth: Payer: Self-pay | Admitting: Family Medicine

## 2019-03-14 DIAGNOSIS — Z1231 Encounter for screening mammogram for malignant neoplasm of breast: Secondary | ICD-10-CM

## 2019-03-14 DIAGNOSIS — R5383 Other fatigue: Secondary | ICD-10-CM

## 2019-03-14 DIAGNOSIS — Z131 Encounter for screening for diabetes mellitus: Secondary | ICD-10-CM

## 2019-03-14 DIAGNOSIS — Z13220 Encounter for screening for lipoid disorders: Secondary | ICD-10-CM

## 2019-03-14 NOTE — Telephone Encounter (Signed)
Patient has physical with Eber Vaughan next month and needing labs done

## 2019-03-16 NOTE — Telephone Encounter (Signed)
Please order: CBC with diff, Met 7, Liver, Lipid, Vitamin D and TSH. Thanks.

## 2019-03-17 ENCOUNTER — Encounter: Payer: Self-pay | Admitting: Family Medicine

## 2019-03-17 ENCOUNTER — Other Ambulatory Visit: Payer: Self-pay

## 2019-03-17 ENCOUNTER — Other Ambulatory Visit: Payer: Self-pay | Admitting: *Deleted

## 2019-03-17 ENCOUNTER — Ambulatory Visit (INDEPENDENT_AMBULATORY_CARE_PROVIDER_SITE_OTHER): Payer: BC Managed Care – PPO | Admitting: Family Medicine

## 2019-03-17 VITALS — BP 118/76 | Temp 97.8°F | Ht 68.0 in | Wt 135.0 lb

## 2019-03-17 DIAGNOSIS — Z0000 Encounter for general adult medical examination without abnormal findings: Secondary | ICD-10-CM

## 2019-03-17 DIAGNOSIS — I7300 Raynaud's syndrome without gangrene: Secondary | ICD-10-CM

## 2019-03-17 DIAGNOSIS — M6780 Other specified disorders of synovium and tendon, unspecified site: Secondary | ICD-10-CM | POA: Diagnosis not present

## 2019-03-17 DIAGNOSIS — M67819 Other specified disorders of synovium and tendon, unspecified shoulder: Secondary | ICD-10-CM | POA: Diagnosis not present

## 2019-03-17 DIAGNOSIS — G629 Polyneuropathy, unspecified: Secondary | ICD-10-CM | POA: Diagnosis not present

## 2019-03-17 DIAGNOSIS — E559 Vitamin D deficiency, unspecified: Secondary | ICD-10-CM

## 2019-03-17 HISTORY — DX: Raynaud's syndrome without gangrene: I73.00

## 2019-03-17 MED ORDER — ETODOLAC 400 MG PO TABS: 400.0000 mg | ORAL_TABLET | Freq: Two times a day (BID) | ORAL | 1 refills | Status: DC

## 2019-03-17 NOTE — Telephone Encounter (Signed)
Blood work ordered in Epic. Patient notified. 

## 2019-03-17 NOTE — Progress Notes (Signed)
   Subjective:    Patient ID: Ovidio Kin, female    DOB: [birth year], 54 y.o.   MRN: [id number]  Shoulder Pain  Pain location: right shoulder. This is a chronic problem. Episode onset: october. She has tried nothing for the symptoms.  Nurse by mistake put in right shoulder but it is actually the left shoulder Very nice patient Onset of shoulder pain and discomfort over the past 4 months Hurts with certain movements especially putting her arm behind her back and lifting her arm above her shoulder.  She relates aching in the shoulder goes across the lateral and posterior aspect no numbness into the hand.  Raynauds.  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  Usually worse in her fingers but recently 2 of her toes have been intermittently numb she does state at times they get blue there has been no sores on them patient at one time was tried on low-dose blood pressure medicine but her blood pressure bottomed now having issues with right toe.    Review of Systems  Constitutional: Negative for activity change, appetite change and fatigue.  HENT: Negative for congestion and rhinorrhea.   Respiratory: Negative for cough and shortness of breath.   Cardiovascular: Negative for chest pain and leg swelling.  Gastrointestinal: Negative for abdominal pain, nausea and vomiting.  Skin: Negative for color change.  Neurological: Positive for tremors. Negative for dizziness, weakness and headaches.  Psychiatric/Behavioral: Negative for agitation, behavioral problems and confusion.  Complains of shoulder pain left side    Objective:   Physical Exam Neck no tenderness good range of motion shoulder limited ROM on the left side difficult time raising her arm straight up above her knee negative drop test.  Empty beer can test normal.  Posterior lift test positive  Lungs clear respiratory normal heart regular foot exam normal     Assessment & Plan:  Because of neuropathy in the  toes I do recommend checking B12 and thyroid Nerve conduction studies would be inconclusive with a small amount of numbness I do not see any sign of circulatory issues currently For the renown syndrome best to try to keep hands and feet warm  Rotator cuff tendinosis range of motion exercises recommended as well as x-rays in addition to this patient may benefit from physical therapy and orthopedic referral if she does not improve over the next 4 weeks with anti-inflammatories plus also range of motion exercises patient is to give [place] feedback within 10 to 14 days how she is doing  She was given a printout of exercises to do over the course of the next 4 to 6 weeks

## 2019-03-18 ENCOUNTER — Ambulatory Visit (HOSPITAL_COMMUNITY)
Admission: RE | Admit: 2019-03-18 | Discharge: 2019-03-18 | Disposition: A | Discharge status: Home / Self Care | Payer: BC Managed Care – PPO | Source: Ambulatory Visit | Attending: Family Medicine | Admitting: Family Medicine

## 2019-03-18 DIAGNOSIS — Z0000 Encounter for general adult medical examination without abnormal findings: Secondary | ICD-10-CM | POA: Diagnosis not present

## 2019-03-18 DIAGNOSIS — M25512 Pain in left shoulder: Secondary | ICD-10-CM | POA: Diagnosis not present

## 2019-03-18 DIAGNOSIS — G629 Polyneuropathy, unspecified: Secondary | ICD-10-CM | POA: Diagnosis not present

## 2019-03-18 DIAGNOSIS — E559 Vitamin D deficiency, unspecified: Secondary | ICD-10-CM | POA: Diagnosis not present

## 2019-03-18 DIAGNOSIS — Z1231 Encounter for screening mammogram for malignant neoplasm of breast: Secondary | ICD-10-CM | POA: Diagnosis not present

## 2019-03-18 DIAGNOSIS — M6780 Other specified disorders of synovium and tendon, unspecified site: Secondary | ICD-10-CM | POA: Diagnosis not present

## 2019-03-19 LAB — BASIC METABOLIC PANEL
BUN/Creatinine Ratio: 15 (ref 9–23)
BUN: 11 mg/dL (ref 6–24)
CO2: 22 mmol/L (ref 20–29)
Calcium: 9.3 mg/dL (ref 8.7–10.2)
Chloride: 101 mmol/L (ref 96–106)
Creatinine, Ser: 0.74 mg/dL (ref 0.57–1.00)
GFR calc Af Amer: 105 mL/min/{1.73_m2} (ref >59)
GFR calc non Af Amer: 91 mL/min/{1.73_m2} (ref >59)
Glucose: 88 mg/dL (ref 65–99)
Potassium: 4.6 mmol/L (ref 3.5–5.2)
Sodium: 139 mmol/L (ref 134–144)

## 2019-03-19 LAB — CBC WITH DIFFERENTIAL/PLATELET
Basophils Absolute: 0.1 10*3/uL (ref 0.0–0.2)
Basos: 1 %
EOS (ABSOLUTE): 0.2 10*3/uL (ref 0.0–0.4)
Eos: 3 %
Hematocrit: 39.9 % (ref 34.0–46.6)
Hemoglobin: 13.4 g/dL (ref 11.1–15.9)
Immature Grans (Abs): 0 10*3/uL (ref 0.0–0.1)
Immature Granulocytes: 0 %
Lymphocytes Absolute: 1.8 10*3/uL (ref 0.7–3.1)
Lymphs: 28 %
MCH: 29 pg (ref 26.6–33.0)
MCHC: 33.6 g/dL (ref 31.5–35.7)
MCV: 86 fL (ref 79–97)
Monocytes Absolute: 0.4 10*3/uL (ref 0.1–0.9)
Monocytes: 7 %
Neutrophils Absolute: 3.9 10*3/uL (ref 1.4–7.0)
Neutrophils: 61 %
Platelets: 295 10*3/uL (ref 150–450)
RBC: 4.62 x10E6/uL (ref 3.77–5.28)
RDW: 12.4 % (ref 11.7–15.4)
WBC: 6.3 10*3/uL (ref 3.4–10.8)

## 2019-03-19 LAB — VITAMIN B12: Vitamin B-12: 380 pg/mL (ref 232–1245)

## 2019-03-19 LAB — HEPATIC FUNCTION PANEL
ALT: 17 IU/L (ref 0–32)
AST: 20 IU/L (ref 0–40)
Albumin: 4.8 g/dL (ref 3.8–4.9)
Alkaline Phosphatase: 66 IU/L (ref 39–117)
Bilirubin Total: 0.4 mg/dL (ref 0.0–1.2)
Bilirubin, Direct: 0.12 mg/dL (ref 0.00–0.40)
Total Protein: 6.8 g/dL (ref 6.0–8.5)

## 2019-03-19 LAB — LIPID PANEL
Chol/HDL Ratio: 3.3 ratio (ref 0.0–4.4)
Cholesterol, Total: 212 mg/dL — ABNORMAL HIGH (ref 100–199)
HDL: 65 mg/dL
LDL Chol Calc (NIH): 125 mg/dL — ABNORMAL HIGH (ref 0–99)
Triglycerides: 123 mg/dL (ref 0–149)
VLDL Cholesterol Cal: 22 mg/dL (ref 5–40)

## 2019-03-19 LAB — TSH: TSH: 2.27 u[IU]/mL (ref 0.450–4.500)

## 2019-03-19 LAB — VITAMIN D 25 HYDROXY (VIT D DEFICIENCY, FRACTURES): Vit D, 25-Hydroxy: 25.4 ng/mL — ABNORMAL LOW (ref 30.0–100.0)

## 2019-04-14 DIAGNOSIS — Z23 Encounter for immunization: Secondary | ICD-10-CM | POA: Diagnosis not present

## 2019-04-22 ENCOUNTER — Encounter: Payer: BC Managed Care – PPO | Admitting: Nurse Practitioner

## 2019-04-28 ENCOUNTER — Other Ambulatory Visit: Payer: Self-pay

## 2019-04-28 ENCOUNTER — Ambulatory Visit: Payer: BC Managed Care – PPO | Attending: Internal Medicine

## 2019-04-28 DIAGNOSIS — Z20822 Contact with and (suspected) exposure to covid-19: Secondary | ICD-10-CM

## 2019-04-29 ENCOUNTER — Encounter: Payer: Self-pay | Admitting: Family Medicine

## 2019-04-29 ENCOUNTER — Encounter: Payer: BC Managed Care – PPO | Admitting: Nurse Practitioner

## 2019-04-29 LAB — SARS-COV-2, NAA 2 DAY TAT

## 2019-04-29 LAB — NOVEL CORONAVIRUS, NAA: SARS-CoV-2, NAA: NOT DETECTED

## 2019-05-13 ENCOUNTER — Encounter: Payer: Self-pay | Admitting: Nurse Practitioner

## 2019-05-13 ENCOUNTER — Ambulatory Visit (INDEPENDENT_AMBULATORY_CARE_PROVIDER_SITE_OTHER): Payer: BC Managed Care – PPO | Admitting: Nurse Practitioner

## 2019-05-13 ENCOUNTER — Other Ambulatory Visit: Payer: Self-pay

## 2019-05-13 VITALS — BP 138/76 | Temp 97.5°F | Ht 67.5 in | Wt 137.8 lb

## 2019-05-13 DIAGNOSIS — Z1151 Encounter for screening for human papillomavirus (HPV): Secondary | ICD-10-CM | POA: Diagnosis not present

## 2019-05-13 DIAGNOSIS — Z01419 Encounter for gynecological examination (general) (routine) without abnormal findings: Secondary | ICD-10-CM

## 2019-05-13 DIAGNOSIS — Z124 Encounter for screening for malignant neoplasm of cervix: Secondary | ICD-10-CM

## 2019-05-13 NOTE — Progress Notes (Signed)
   Subjective:    Patient ID: Olivia Johnson, female    DOB: 11/01/1965, 54 y.o.   MRN: [id number]  HPI   The patient comes in today for a wellness visit.    A review of their health history was completed.  A review of medications was also completed.  Any needed refills; Hydroxyzine to have on hand of needed  Eating habits: pretty good  Falls/  MVA accidents in past few months: none  Regular exercise: yes. Walking, hiking and kayaking  Specialist pt sees on regular basis: none  Preventative health issues were discussed.   Additional concerns: Requesting referral for PT for shoulder and discuss dry throat  Review of Systems     Objective:   Physical Exam        Assessment & Plan:

## 2019-05-13 NOTE — Progress Notes (Signed)
Subjective:    Patient ID: Olivia Johnson, female    DOB: 1965-02-06, 54 y.o.   MRN: [id number]  HPI presents today for physical and pap smear. No cycle since 2018. Postmenopausal. Same sexual partner. No pelvic pain. Regular dental and vision care. Slight weight gain over the last year. Recent shoulder injury and would like a referral to physical therapy. Has chronic dry throat that is more prominent at night time. Denies mouth breathing with sleep. No signs of sleep apnea. Also has dry eyes with frequent tearing.    Depression screen [facility] 2/9 05/13/2019 05/13/2019 07/08/2016  Decreased Interest 1 1 0  Down, Depressed, Hopeless 0 0 0  PHQ - 2 Score 1 1 0    Review of Systems  Constitutional: Negative for fatigue.  HENT:       Dry throat  Eyes: Negative for redness.       Dry eyes  Respiratory: Negative for cough, shortness of breath and wheezing.   Cardiovascular: Negative for chest pain and palpitations.  Gastrointestinal: Negative for abdominal distention, abdominal pain, anal bleeding, blood in stool, constipation, diarrhea, nausea and vomiting.  Endocrine: Negative for cold intolerance and heat intolerance.  Genitourinary: Negative for difficulty urinating, dysuria, enuresis, flank pain, frequency, genital sores, pelvic pain, urgency, vaginal bleeding, vaginal discharge and vaginal pain.  Psychiatric/Behavioral: Negative for sleep disturbance and suicidal ideas.       Objective:   Physical Exam Vitals reviewed. Exam conducted with a chaperone present.  Constitutional:      General: She is not in acute distress.    Appearance: Normal appearance.  HENT:     Mouth/Throat:     Mouth: Mucous membranes are moist.     Pharynx: Oropharynx is clear. No oropharyngeal exudate.  Eyes:     Comments: Conjunctivae minimally injected bilaterally.   Neck:     Thyroid: No thyroid mass or thyroid tenderness.  Cardiovascular:     Rate and Rhythm: Normal rate and regular rhythm.     Heart  sounds: Normal heart sounds. No murmur.  Pulmonary:     Effort: Pulmonary effort is normal.     Breath sounds: Normal breath sounds.  Chest:     Breasts:        Right: Normal. No swelling, inverted nipple, mass, skin change or tenderness.        Left: Normal. No swelling, inverted nipple, mass, skin change or tenderness.  Abdominal:     General: Abdomen is flat. There is no distension.     Palpations: Abdomen is soft.     Tenderness: There is no abdominal tenderness.  Genitourinary:    Labia:        Right: No tenderness or lesion.        Left: No tenderness or lesion.      Urethra: No urethral pain or urethral lesion.     Vagina: No vaginal discharge.     Comments: Bimanual exam: no CMT. No tenderness or obvious masses.  Lymphadenopathy:     Cervical: No cervical adenopathy.     Upper Body:     Right upper body: No supraclavicular, axillary or pectoral adenopathy.     Left upper body: No supraclavicular, axillary or pectoral adenopathy.  Skin:    General: Skin is warm and dry.  Neurological:     Mental Status: She is alert.  Psychiatric:        Mood and Affect: Mood normal.        Behavior: Behavior normal.  Thought Content: Thought content normal.        Judgment: Judgment normal.             Assessment & Plan:   Problem List Items Addressed This Visit    None    Visit Diagnoses    Well woman exam    -  Primary    No orders of the defined types were placed in this encounter.  Education: Continue exercise regimen. Will order PT for left shoulder. Continue taking your daily supplements for Vit. D and B12. OTC biotene for dry throat, use at night as needed. OTC lubricant drops for her eyes. Call back if persists or new symptoms. Continue healthy diet and activity. Return in about 1 year (around 05/12/2020) for physical.

## 2019-05-14 DIAGNOSIS — Z23 Encounter for immunization: Secondary | ICD-10-CM | POA: Diagnosis not present

## 2019-05-16 ENCOUNTER — Encounter: Payer: Self-pay | Admitting: Nurse Practitioner

## 2019-05-17 LAB — IGP, APTIMA HPV: HPV Aptima: NEGATIVE

## 2019-05-23 ENCOUNTER — Other Ambulatory Visit: Payer: Self-pay | Admitting: *Deleted

## 2019-05-23 DIAGNOSIS — M25511 Pain in right shoulder: Secondary | ICD-10-CM

## 2019-05-26 ENCOUNTER — Encounter: Payer: Self-pay | Admitting: Family Medicine

## 2019-05-26 ENCOUNTER — Telehealth: Payer: Self-pay | Admitting: Family Medicine

## 2019-05-26 DIAGNOSIS — M25511 Pain in right shoulder: Secondary | ICD-10-CM

## 2019-05-26 NOTE — Telephone Encounter (Signed)
Can you change this order? I'm using my app to check messages. Still learning! Thanks

## 2019-05-26 NOTE — Telephone Encounter (Signed)
Olivia Johnson with [facility] calling requesting Olivia Johnson change request for PT to a request for OT. They are aware Olivia Johnson is not in today but will be tomorrow.

## 2019-05-26 NOTE — Telephone Encounter (Signed)
Yes ma'am. I went ahead and changed the order.

## 2019-05-26 NOTE — Telephone Encounter (Signed)
Please advise. Thank you

## 2019-06-07 ENCOUNTER — Ambulatory Visit (HOSPITAL_COMMUNITY): Payer: BC Managed Care – PPO | Attending: Nurse Practitioner

## 2019-06-07 ENCOUNTER — Other Ambulatory Visit: Payer: Self-pay

## 2019-06-07 ENCOUNTER — Encounter (HOSPITAL_COMMUNITY): Payer: Self-pay

## 2019-06-07 DIAGNOSIS — M25512 Pain in left shoulder: Secondary | ICD-10-CM | POA: Insufficient documentation

## 2019-06-07 DIAGNOSIS — R29898 Other symptoms and signs involving the musculoskeletal system: Secondary | ICD-10-CM | POA: Insufficient documentation

## 2019-06-07 DIAGNOSIS — M25612 Stiffness of left shoulder, not elsewhere classified: Secondary | ICD-10-CM | POA: Insufficient documentation

## 2019-06-07 NOTE — Patient Instructions (Signed)
Complete the following exercises 2-3 times a day.  Doorway Stretch  Place each hand opposite each other on the doorway. (You can change where you feel the stretch by moving arms higher or lower.) Step through with one foot and bend front knee until a stretch is felt and hold. Step through with the opposite foot on the next rep. Hold for __10-15___ seconds. Repeat __2__times.      INTERNAL ROTATION TOWEL STRETCH - IR TOWEL  Gently pull up your affected arm behind your back with the assist of a towel. Hold this as a stretch, then lower back down and repeat.    Hold for 30 seconds. Complete 2 times.        Wall Flexion  Slide your arm up the wall or door frame until a stretch is felt in your shoulder . Hold for 30 seconds. Complete 2 times     ELASTIC BAND SHOULDER EXTERNAL ROTATION - ER  While holding an elastic band at your side with your elbow bent, start with your hand near your stomach and then pull the band away. Keep your elbow at your side the entire time.  Complete 10-15 times.    ELASTIC BAND SHOULDER INTERNAL ROTATION - IR  While holding an elastic band at your side with your elbow bent, start with your hand away from your stomach, then pull the band towards your stomach. Keep your elbow near your side the entire time.   Complete 10-15 times.

## 2019-06-07 NOTE — Therapy (Signed)
[facility] [address] [place], [place], [address] Phone: [phone]   Fax:  [phone]  Occupational Therapy Evaluation  Patient Details  Name: Olivia Johnson MRN: [id number] Date of Birth: Nov 27, 1965 Referring Provider (OT): Pearson Forster, NP   Encounter Date: 06/07/2019  OT End of Session - 06/07/19 1539    Visit Number  1    Number of Visits  8    Date for OT Re-Evaluation  07/05/19    Authorization Type  [employer] Commercial PPO    Authorization Time Period  60 visit limit combined. 0 used. No copay    Authorization - Visit Number  1    Authorization - Number of Visits  54    OT Start Time  1300    OT Stop Time  0175    OT Time Calculation (min)  47 min    Activity Tolerance  Patient tolerated treatment well    Behavior During Therapy  WFL for tasks assessed/performed       Past Medical History:  Diagnosis Date  . Raynaud's syndrome 03/17/2019   Has had this since teenager years mainly in the hands some in the toes    Past Surgical History:  Procedure Laterality Date  . COLONOSCOPY N/A 09/17/2016   Procedure: COLONOSCOPY;  Surgeon: Daneil Dolin, MD;  [facility];  Service: Endoscopy;  Laterality: N/A;  2:30 pm    There were no vitals filed for this visit.  Subjective Assessment - 06/07/19 1313    Subjective   S: It mayhave happened from kayaking.    Pertinent History  Patient is a 54 y/o female S/P left shoulder pain possibly from rotator cuff tendinosis which began October 2020. Patient began to experience pain the following day after kayaking. Pearson Forster, NP has referred patient to occupational therapy for evaluation and treatment.    Patient Stated Goals  To decrease pain and increase comfort.    Currently in Pain?  No/denies    Pain Score  0-No pain   1/02 with certain shoulder movement       [facility] OT Assessment - 06/07/19 1315      Assessment   Medical Diagnosis  left shoulder pain    Referring Provider (OT)  Pearson Forster, NP    Onset Date/Surgical Date  --   Oct. 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       AROM   Overall AROM Comments  Assessed standing. IR/er abducted    AROM Assessment Site  Shoulder    Right/Left Shoulder  Left    Left Shoulder Flexion  135 Degrees    Left Shoulder ABduction  180 Degrees    Left Shoulder Internal Rotation  18 Degrees    Left Shoulder External Rotation  50 Degrees      PROM   Overall PROM Comments  Assessed supine. IR/er abducted    PROM Assessment Site  Shoulder    Right/Left Shoulder  Left    Left Shoulder Flexion  160 Degrees    Left Shoulder ABduction  180 Degrees    Left Shoulder Internal Rotation  60 Degrees    Left Shoulder External Rotation  30 Degrees      Strength   Overall Strength Comments  Assessed standing. IR/er abducted    Strength Assessment Site  Shoulder    Right/Left Shoulder  Left     Left Shoulder Flexion  4+/5    Left Shoulder ABduction  5/5    Left Shoulder Internal Rotation  5/5    Left Shoulder External Rotation  4+/5                      OT Education - 06/07/19 1539    Education Details  shoulder stretches. red band external and internal rotation strengthening    Person(s) Educated  Patient    Methods  Explanation;Demonstration;Verbal cues;Handout;Tactile cues    Comprehension  Returned demonstration;Verbalized understanding       OT Short Term Goals - 06/07/19 1543      OT SHORT TERM GOAL #1   Title  Patient will be educated and independent with HEP in order to faciliate progress in therapy and increase functional use of LUE during daily tasks.    Time  4    Period  Weeks    Status  New    Target Date  07/05/19      OT SHORT TERM GOAL #2   Title  Patient will increase LUE scapular and shoulder strength to 5/5 in order to return to full use of her LUE and be able to return to kayaking for several hours without complaints of discomfort.    Time  4    Period  Weeks    Status  New      OT SHORT TERM GOAL #3   Title  Patient will increase A/ROM of her LUE to WNL and return to completing all overhead, behind the back, and behind the back motions without difficulty.    Time  4    Period  Weeks    Status  New      OT SHORT TERM GOAL #4   Title  Patient will report a decrease in pain of approximately 2/10 or less during functional tasks when using her LUE.    Time  4    Period  Weeks    Status  New               Plan - 06/07/19 1540    Clinical Impression Statement  A: Patient is a 54 y/o female S/P left shoulder pain  causing increased pain and decreased ROM and strength resulting in difficulty completing daily tasks using her LUE comfortably.    OT Occupational Profile and History  Problem Focused Assessment - Including review of records relating to presenting problem    Occupational performance deficits (Please refer to  evaluation for details):  ADL's;Rest and Sleep;Leisure;Work    Games developer / Function / Physical Skills  ADL;ROM;Strength;Pain    Rehab Potential  Excellent    Clinical Decision Making  Limited treatment options, no task modification necessary    Comorbidities Affecting Occupational Performance:  None    Modification or Assistance to Complete Evaluation   No modification of tasks or assist necessary to complete eval    OT Frequency  2x / week    OT Duration  4 weeks    OT Treatment/Interventions  Self-care/ADL training;Therapeutic exercise;Manual Therapy;Neuromuscular education;Ultrasound;Therapeutic activities;Cryotherapy;Electrical Stimulation;Moist Heat;Passive range of motion;Patient/family education    Plan  P: Pt will benefit from skilled OT services to increase functional performance during daily tasks using her LUE. Treatment Plan: Manual techniques if needed. Passive stretching, A/ROM, general shoulder and scapular strengthening. Modalities PRN.       Patient will benefit from skilled therapeutic intervention in order to improve the following deficits and impairments:   Body Structure / Function / Physical Skills: ADL, ROM, Strength, Pain       Visit Diagnosis: Other symptoms and signs involving the musculoskeletal system - Plan: Ot plan of care cert/re-cert  Stiffness of left shoulder, not elsewhere classified - Plan: Ot plan of care cert/re-cert  Acute pain of left shoulder - Plan: Ot plan of care cert/re-cert    Problem List Patient Active Problem List   Diagnosis Date Noted  . Raynaud's syndrome 03/17/2019  . Vitamin D insufficiency 07/13/2016   Limmie Patricia, OTR/L,CBIS  [phone]  06/07/2019, 3:53 PM  [facility] [address] [place], [place], [address] Phone: [phone]   Fax:  [phone]  Name: Olivia Johnson MRN: [id number] Date of Birth: [birth year]

## 2019-06-09 ENCOUNTER — Other Ambulatory Visit: Payer: Self-pay

## 2019-06-09 ENCOUNTER — Encounter (HOSPITAL_COMMUNITY): Payer: Self-pay | Admitting: Occupational Therapy

## 2019-06-09 ENCOUNTER — Ambulatory Visit (HOSPITAL_COMMUNITY): Payer: BC Managed Care – PPO | Admitting: Occupational Therapy

## 2019-06-09 DIAGNOSIS — M25612 Stiffness of left shoulder, not elsewhere classified: Secondary | ICD-10-CM | POA: Diagnosis not present

## 2019-06-09 DIAGNOSIS — M25512 Pain in left shoulder: Secondary | ICD-10-CM

## 2019-06-09 DIAGNOSIS — R29898 Other symptoms and signs involving the musculoskeletal system: Secondary | ICD-10-CM

## 2019-06-09 NOTE — Therapy (Signed)
[facility] [place], [place], [address] Phone: [phone]   Fax:  [phone]  Occupational Therapy Treatment  Patient Details  Name: Olivia Johnson MRN: [id number] Date of Birth: 1965-07-29 Referring Provider (OT): Pearson Forster, NP   Encounter Date: 06/09/2019  OT End of Session - 06/09/19 1133    Visit Number  2    Number of Visits  8    Date for OT Re-Evaluation  07/05/19    Authorization Type  [employer] Commercial PPO    Authorization Time Period  60 visit limit combined. 0 used. No copay    Authorization - Visit Number  2    Authorization - Number of Visits  18    OT Start Time  [phone]    OT Stop Time  1029    OT Time Calculation (min)  39 min    Activity Tolerance  Patient tolerated treatment well    Behavior During Therapy  WFL for tasks assessed/performed       Past Medical History:  Diagnosis Date  . Raynaud's syndrome 03/17/2019   Has had this since teenager years mainly in the hands some in the toes    Past Surgical History:  Procedure Laterality Date  . COLONOSCOPY N/A 09/17/2016   Procedure: COLONOSCOPY;  Surgeon: Daneil Dolin, MD;  [facility];  Service: Endoscopy;  Laterality: N/A;  2:30 pm    There were no vitals filed for this visit.  Subjective Assessment - 06/09/19 1003    Subjective   S: I think I do have one little place that feels like a knot.    Currently in Pain?  No/denies    Pain Score  --   3/10 with movement   [name]  No         [facility] OT Assessment - 06/09/19 1003      Assessment   Medical Diagnosis  left shoulder pain      Precautions   Precautions  None               OT Treatments/Exercises (OP) - 06/09/19 0952      Exercises   Exercises  Shoulder      Shoulder Exercises: Supine   Protraction  PROM;5 reps;AROM;10 reps    Horizontal ABduction  PROM;5 reps;AROM;10 reps    External Rotation  PROM;5 reps;AROM;10 reps    Internal  Rotation  PROM;5 reps;AROM;10 reps    Flexion  PROM;5 reps;AROM;10 reps    ABduction  PROM;5 reps;AROM;10 reps      Shoulder Exercises: ROM/Strengthening   Other ROM/Strengthening Exercises  cone pass over shoulder for IR, ball pass behind head for er. 10X each      Shoulder Exercises: Stretch   Internal Rotation Stretch  2 reps   30" holds, vertical towel   External Rotation Stretch  2 reps;30 seconds    Wall Stretch - Flexion  2 reps;30 seconds               OT Short Term Goals - 06/09/19 1003      OT SHORT TERM GOAL #1   Title  Patient will be educated and independent with HEP in order to faciliate progress in therapy and increase functional use of LUE during daily tasks.    Time  4    Period  Weeks    Status  On-going    Target Date  07/05/19      OT SHORT TERM GOAL #  2   Title  Patient will increase LUE scapular and shoulder strength to 5/5 in order to return to full use of her LUE and be able to return to kayaking for several hours without complaints of discomfort.    Time  4    Period  Weeks    Status  On-going      OT SHORT TERM GOAL #3   Title  Patient will increase A/ROM of her LUE to WNL and return to completing all overhead, behind the back, and behind the back motions without difficulty.    Time  4    Period  Weeks    Status  On-going      OT SHORT TERM GOAL #4   Title  Patient will report a decrease in pain of approximately 2/10 or less during functional tasks when using her LUE.    Time  4    Period  Weeks    Status  On-going               Plan - 06/09/19 1019    Clinical Impression Statement  A: Pt reports she has completed her HEP with no problems. Pt reports one spot that feels like a knot on the lateral upper arm, not present on RUE. One trace restriction palpated. Pt completing passive stretching, A/ROM supine, shoulder stretches and ROM exercises in standing. Verbal cuing for form and technique. Pt pleased with increase in ROM for IR  today.    Body Structure / Function / Physical Skills  ADL;ROM;Strength;Pain    Plan  P: Continue with stretches and er/IR work, add x to v arms and Y lift off in standing; add scapular theraband    OT Home Exercise Plan  eval: shoulder stretches, er/IR with red theraband    Consulted and Agree with Plan of Care  Patient       Patient will benefit from skilled therapeutic intervention in order to improve the following deficits and impairments:   Body Structure / Function / Physical Skills: ADL, ROM, Strength, Pain       Visit Diagnosis: Other symptoms and signs involving the musculoskeletal system  Stiffness of left shoulder, not elsewhere classified  Acute pain of left shoulder    Problem List Patient Active Problem List   Diagnosis Date Noted  . Raynaud's syndrome 03/17/2019  . Vitamin D insufficiency 07/13/2016   Olivia Johnson, Olivia Johnson  [phone] 06/09/2019, 11:34 AM  [facility] [address] [place], [place], [address] Phone: [phone]   Fax:  [phone]  Name: Olivia Johnson MRN: [id number] Date of Birth: January 23, 1966

## 2019-06-14 ENCOUNTER — Other Ambulatory Visit: Payer: Self-pay

## 2019-06-14 ENCOUNTER — Encounter (HOSPITAL_COMMUNITY): Payer: Self-pay | Admitting: Occupational Therapy

## 2019-06-14 ENCOUNTER — Ambulatory Visit (HOSPITAL_COMMUNITY): Payer: BC Managed Care – PPO | Admitting: Occupational Therapy

## 2019-06-14 DIAGNOSIS — M25512 Pain in left shoulder: Secondary | ICD-10-CM

## 2019-06-14 DIAGNOSIS — M25612 Stiffness of left shoulder, not elsewhere classified: Secondary | ICD-10-CM

## 2019-06-14 DIAGNOSIS — R29898 Other symptoms and signs involving the musculoskeletal system: Secondary | ICD-10-CM

## 2019-06-14 NOTE — Therapy (Signed)
[facility] [place], [place], [address] Phone: [phone]   Fax:  [phone]  Occupational Therapy Treatment  Patient Details  Name: Olivia Johnson MRN: [id number] Date of Birth: 1965/12/25 Referring Provider (OT): Pearson Forster, NP   Encounter Date: 06/14/2019  OT End of Session - 06/14/19 1557    Visit Number  3    Number of Visits  8    Date for OT Re-Evaluation  07/05/19    Authorization Type  [employer] Commercial PPO    Authorization Time Period  60 visit limit combined. 0 used. No copay    Authorization - Visit Number  3    Authorization - Number of Visits  30    OT Start Time  1540    OT Stop Time  1556    OT Time Calculation (min)  40 min    Activity Tolerance  Patient tolerated treatment well    Behavior During Therapy  WFL for tasks assessed/performed       Past Medical History:  Diagnosis Date  . Raynaud's syndrome 03/17/2019   Has had this since teenager years mainly in the hands some in the toes    Past Surgical History:  Procedure Laterality Date  . COLONOSCOPY N/A 09/17/2016   Procedure: COLONOSCOPY;  Surgeon: Daneil Dolin, MD;  [facility];  Service: Endoscopy;  Laterality: N/A;  2:30 pm    There were no vitals filed for this visit.  Subjective Assessment - 06/14/19 1514    Subjective   S: I wasn't able to do any exercises over the weekend but I've done them today.    Currently in Pain?  No/denies    Pain Score  --   3/10 with movement        [facility] OT Assessment - 06/14/19 1514      Assessment   Medical Diagnosis  left shoulder pain      Precautions   Precautions  None               OT Treatments/Exercises (OP) - 06/14/19 1518      Exercises   Exercises  Shoulder      Shoulder Exercises: Supine   Protraction  PROM;5 reps;AROM;10 reps    Horizontal ABduction  PROM;5 reps;AROM;10 reps    External Rotation  PROM;5 reps;AROM;10 reps    Internal Rotation   PROM;5 reps;AROM;10 reps    Flexion  PROM;5 reps;AROM;10 reps    ABduction  PROM;5 reps;AROM;10 reps      Shoulder Exercises: Standing   Extension  Theraband;10 reps    Theraband Level (Shoulder Extension)  Level 2 (Red)    Row  Theraband;10 reps    Theraband Level (Shoulder Row)  Level 2 (Red)    Retraction  Theraband;10 reps    Theraband Level (Shoulder Retraction)  Level 2 (Red)      Shoulder Exercises: ROM/Strengthening   UBE (Upper Arm Bike)  Level 1 3' reverse   pace: 4.0-5.0   X to V Arms  10X    Other ROM/Strengthening Exercises  Y lift off: 10X      Shoulder Exercises: Stretch   Internal Rotation Stretch  2 reps   30" holds, vertical towel   External Rotation Stretch  2 reps;30 seconds    Wall Stretch - Flexion  2 reps;30 seconds      Manual Therapy   Manual Therapy  Myofascial release    Manual therapy comments  completed separately from  therapeutic exercises    Myofascial Release  myofascial release to left upper arm and anterior shoulder to decrease pain and fascial restrictions and increase joint ROM               OT Short Term Goals - 06/09/19 1003      OT SHORT TERM GOAL #1   Title  Patient will be educated and independent with HEP in order to faciliate progress in therapy and increase functional use of LUE during daily tasks.    Time  4    Period  Weeks    Status  On-going    Target Date  07/05/19      OT SHORT TERM GOAL #2   Title  Patient will increase LUE scapular and shoulder strength to 5/5 in order to return to full use of her LUE and be able to return to kayaking for several hours without complaints of discomfort.    Time  4    Period  Weeks    Status  On-going      OT SHORT TERM GOAL #3   Title  Patient will increase A/ROM of her LUE to WNL and return to completing all overhead, behind the back, and behind the back motions without difficulty.    Time  4    Period  Weeks    Status  On-going      OT SHORT TERM GOAL #4   Title  Patient  will report a decrease in pain of approximately 2/10 or less during functional tasks when using her LUE.    Time  4    Period  Weeks    Status  On-going               Plan - 06/14/19 1532    Clinical Impression Statement  A: Pt with min fascial restrictions today, reports increased pain in bicep region, manual techniques completed to address. Continued with passive stretching, pt with good joint capsule movement, primarily limited by pain and muscle tightness. Continued with A/ROM, pt reporting pain during muscle contraction but resolves at rest. Added Y lift off and x to v arms, scapular theraband, and UBE today. Verbal cuing for form and technique. Pt demonstrates ROM WNL for flexion during session, and is continuing to improve with IR.    Body Structure / Function / Physical Skills  ADL;ROM;Strength;Pain    Plan  P: Update HEP for scapular theraband, continue with manual therapy, add ball on wall    OT Home Exercise Plan  eval: shoulder stretches, er/IR with red theraband    Consulted and Agree with Plan of Care  Patient       Patient will benefit from skilled therapeutic intervention in order to improve the following deficits and impairments:   Body Structure / Function / Physical Skills: ADL, ROM, Strength, Pain       Visit Diagnosis: Other symptoms and signs involving the musculoskeletal system  Stiffness of left shoulder, not elsewhere classified  Acute pain of left shoulder    Problem List Patient Active Problem List   Diagnosis Date Noted  . Raynaud's syndrome 03/17/2019  . Vitamin D insufficiency 07/13/2016   Ezra Sites, OTR/L  [phone] 06/14/2019, 3:59 PM  [facility] [address] [place], [place], [address] Phone: [phone]   Fax:  [phone]  Name: Olivia Johnson MRN: [id number] Date of Birth: 1965/10/13

## 2019-06-16 ENCOUNTER — Other Ambulatory Visit: Payer: Self-pay

## 2019-06-16 ENCOUNTER — Ambulatory Visit (HOSPITAL_COMMUNITY): Payer: BC Managed Care – PPO | Admitting: Occupational Therapy

## 2019-06-16 ENCOUNTER — Encounter (HOSPITAL_COMMUNITY): Payer: Self-pay | Admitting: Occupational Therapy

## 2019-06-16 DIAGNOSIS — M25612 Stiffness of left shoulder, not elsewhere classified: Secondary | ICD-10-CM | POA: Diagnosis not present

## 2019-06-16 DIAGNOSIS — R29898 Other symptoms and signs involving the musculoskeletal system: Secondary | ICD-10-CM | POA: Diagnosis not present

## 2019-06-16 DIAGNOSIS — M25512 Pain in left shoulder: Secondary | ICD-10-CM | POA: Diagnosis not present

## 2019-06-16 NOTE — Therapy (Signed)
[facility] [address] [place], [place], [address] Phone: [phone]   Fax:  [phone]  Occupational Therapy Treatment  Patient Details  Name: Olivia Johnson MRN: [id number] Date of Birth: 1965/05/14 Referring Provider (OT): Sherie Don, NP   Encounter Date: 06/16/2019  OT End of Session - 06/16/19 1030    Visit Number  4    Number of Visits  8    Date for OT Re-Evaluation  07/05/19    Authorization Type  [employer] Commercial PPO    Authorization Time Period  60 visit limit combined. 0 used. No copay    Authorization - Visit Number  4    Authorization - Number of Visits  60    OT Start Time  0950    OT Stop Time  1030    OT Time Calculation (min)  40 min    Activity Tolerance  Patient tolerated treatment well    Behavior During Therapy  WFL for tasks assessed/performed       Past Medical History:  Diagnosis Date  . Raynaud's syndrome 03/17/2019   Has had this since teenager years mainly in the hands some in the toes    Past Surgical History:  Procedure Laterality Date  . COLONOSCOPY N/A 09/17/2016   Procedure: COLONOSCOPY;  Surgeon: Corbin Ade, MD;  [facility];  Service: Endoscopy;  Laterality: N/A;  2:30 pm    There were no vitals filed for this visit.  Subjective Assessment - 06/16/19 0949    Subjective   S: I'm noticing an improvment sleeping on it.    Currently in Pain?  No/denies         [facility] OT Assessment - 06/16/19 0949      Assessment   Medical Diagnosis  left shoulder pain      Precautions   Precautions  None               OT Treatments/Exercises (OP) - 06/16/19 0956      Exercises   Exercises  Shoulder      Shoulder Exercises: Supine   Protraction  PROM;5 reps    Horizontal ABduction  PROM;5 reps    External Rotation  PROM;5 reps    Internal Rotation  PROM;5 reps    Flexion  PROM;5 reps    ABduction  PROM;5 reps      Shoulder Exercises: Standing   Protraction   AROM;10 reps    Horizontal ABduction  AROM;10 reps    External Rotation  AROM;10 reps    Internal Rotation  AROM;10 reps    Flexion  AROM;10 reps    ABduction  AROM;10 reps    Extension  Theraband;10 reps    Theraband Level (Shoulder Extension)  Level 2 (Red)    Row  Theraband;10 reps    Theraband Level (Shoulder Row)  Level 2 (Red)    Retraction  Theraband;10 reps    Theraband Level (Shoulder Retraction)  Level 2 (Red)      Shoulder Exercises: ROM/Strengthening   X to V Arms  10X    Ball on Wall  1' flexion 1' abduction    Other ROM/Strengthening Exercises  Y lift off: 10X      Shoulder Exercises: Stretch   External Rotation Stretch  2 reps;30 seconds   arm on wall at shoulder height     Manual Therapy   Manual Therapy  Myofascial release    Manual therapy comments  completed separately from therapeutic exercises  Myofascial Release  myofascial release to left upper arm and anterior shoulder to decrease pain and fascial restrictions and increase joint ROM             OT Education - 06/16/19 1030    Education Details  scapular theraband    Person(s) Educated  Patient    Methods  Explanation;Demonstration;Verbal cues;Handout;Tactile cues    Comprehension  Returned demonstration;Verbalized understanding       OT Short Term Goals - 06/09/19 1003      OT SHORT TERM GOAL #1   Title  Patient will be educated and independent with HEP in order to faciliate progress in therapy and increase functional use of LUE during daily tasks.    Time  4    Period  Weeks    Status  On-going    Target Date  07/05/19      OT SHORT TERM GOAL #2   Title  Patient will increase LUE scapular and shoulder strength to 5/5 in order to return to full use of her LUE and be able to return to kayaking for several hours without complaints of discomfort.    Time  4    Period  Weeks    Status  On-going      OT SHORT TERM GOAL #3   Title  Patient will increase A/ROM of her LUE to WNL and return  to completing all overhead, behind the back, and behind the back motions without difficulty.    Time  4    Period  Weeks    Status  On-going      OT SHORT TERM GOAL #4   Title  Patient will report a decrease in pain of approximately 2/10 or less during functional tasks when using her LUE.    Time  4    Period  Weeks    Status  On-going               Plan - 06/16/19 1018    Clinical Impression Statement  A: Pt reports she feels like it is progressing and improving, however she is more aware of her shoulder now. Pt with full ROM today both passive and actively, continued with manual therapy to address fascial restrictions limiting ROM. Continued with scapular theraband and updated for HEP. Added ball on wall. Verbal cuing for form and technique.    Body Structure / Function / Physical Skills  ADL;ROM;Strength;Pain    Plan  P: Follow up on HEP, add loop theraband    OT Home Exercise Plan  eval: shoulder stretches, er/IR with red theraband; 5/13: scapular theraband    Consulted and Agree with Plan of Care  Patient       Patient will benefit from skilled therapeutic intervention in order to improve the following deficits and impairments:   Body Structure / Function / Physical Skills: ADL, ROM, Strength, Pain       Visit Diagnosis: Other symptoms and signs involving the musculoskeletal system  Stiffness of left shoulder, not elsewhere classified  Acute pain of left shoulder    Problem List Patient Active Problem List   Diagnosis Date Noted  . Raynaud's syndrome 03/17/2019  . Vitamin D insufficiency 07/13/2016   Guadelupe Sabin, OTR/L  [phone] 06/16/2019, 10:31 AM  [place] [address] [place], [place], [address] Phone: [phone]   Fax:  [phone]  Name: Olivia Johnson MRN: [id number] Date of Birth: Aug 29, 1965

## 2019-06-16 NOTE — Patient Instructions (Signed)

## 2019-06-20 ENCOUNTER — Encounter (HOSPITAL_COMMUNITY): Payer: Self-pay | Admitting: Occupational Therapy

## 2019-06-20 ENCOUNTER — Ambulatory Visit (HOSPITAL_COMMUNITY): Payer: BC Managed Care – PPO | Admitting: Occupational Therapy

## 2019-06-20 ENCOUNTER — Other Ambulatory Visit: Payer: Self-pay

## 2019-06-20 DIAGNOSIS — M25512 Pain in left shoulder: Secondary | ICD-10-CM

## 2019-06-20 DIAGNOSIS — M25612 Stiffness of left shoulder, not elsewhere classified: Secondary | ICD-10-CM | POA: Diagnosis not present

## 2019-06-20 DIAGNOSIS — R29898 Other symptoms and signs involving the musculoskeletal system: Secondary | ICD-10-CM

## 2019-06-20 NOTE — Patient Instructions (Addendum)
Complete each exercise 10-15X, 1-2x/day  1) Wall slide: Place an elastic band around your arms at the level of your wrists as shown. Next, place your forearms and hands along a wall so that your elbows are bent and your arms point towards the ceiling.  Then, protract your shoulder blades forward and then slide your arms up the wall as shown.   2) Lateral Wall Walks: With hands against wall, walk or slide your hands to the side against resistance of the band.   3) Upward/Diagonal Wall Walks: Walk or slide your hand up the wall in a diagonal direction, going against the resistance of the band.      Repeat all exercises 10-15 times, 1-2 times per day.  1) Shoulder Protraction    Begin with elbows by your side, slowly "punch" straight out in front of you.      2) Shoulder Flexion Standing:         Begin with arms at your side with thumbs pointed up, slowly raise both arms up and forward towards overhead.               3) Horizontal abduction/adduction  Standing:           Begin with arms straight out in front of you, bring out to the side in at "T" shape. Keep arms straight entire time.                 4) Internal & External Rotation  Standing:    Stand with elbows at the side and elbows bent 90 degrees. Move your forearms away from your body, then bring back inward toward the body.     5) Shoulder Abduction  Standing:       Lying on your back begin with your arms flat on the table next to your side. Slowly move your arms out to the side so that they go overhead, in a jumping jack or snow angel movement.     6) X to V arms (cheerleader move):  Begin with arms straight down, crossed in front of body in an "X". Keeping arms crossed, lift arms straight up overhead. Then spread arms apart into a "V" shape.  Bring back together into x and lower down to starting position.

## 2019-06-20 NOTE — Therapy (Signed)
[facility] [address] [place], [place], [address] Phone: [phone]   Fax:  [phone]  Occupational Therapy Treatment  Patient Details  Name: Olivia Johnson MRN: [id number] Date of Birth: [birth year] Referring Provider (OT): Sherie Don, NP   Encounter Date: 06/20/2019  OT End of Session - 06/20/19 1545    Visit Number  5    Number of Visits  8    Date for OT Re-Evaluation  07/05/19    Authorization Type  [employer] Commercial PPO    Authorization Time Period  60 visit limit combined. 0 used. No copay    Authorization - Visit Number  5    Authorization - Number of Visits  60    OT Start Time  1430    OT Stop Time  1518    OT Time Calculation (min)  48 min    Activity Tolerance  Patient tolerated treatment well    Behavior During Therapy  WFL for tasks assessed/performed       Past Medical History:  Diagnosis Date  . Raynaud's syndrome 03/17/2019   Has had this since teenager years mainly in the hands some in the toes    Past Surgical History:  Procedure Laterality Date  . COLONOSCOPY N/A 09/17/2016   Procedure: COLONOSCOPY;  Surgeon: Corbin Ade, MD;  [facility];  Service: Endoscopy;  Laterality: N/A;  2:30 pm    There were no vitals filed for this visit.  Subjective Assessment - 06/20/19 1427    Subjective   S: It's pretty much the same, pain with movement.    Currently in Pain?  No/denies         [facility] OT Assessment - 06/20/19 1426      Assessment   Medical Diagnosis  left shoulder pain      Precautions   Precautions  None      AROM   Overall AROM Comments  Assessed standing. IR/er abducted    AROM Assessment Site  Shoulder    Right/Left Shoulder  Left    Left Shoulder Flexion  171 Degrees   135 previous   Left Shoulder ABduction  180 Degrees   same as previous   Left Shoulder Internal Rotation  70 Degrees   18 previous   Left Shoulder External Rotation  78 Degrees   50 previous     PROM   Overall PROM Comments  Assessed supine. IR/er abducted    PROM Assessment Site  Shoulder    Left Shoulder Flexion  170 Degrees   160 previous   Left Shoulder ABduction  180 Degrees   same as previous   Left Shoulder Internal Rotation  90 Degrees   60 previous   Left Shoulder External Rotation  60 Degrees   30 previous     Strength   Overall Strength Comments  Assessed standing. IR/er abducted    Strength Assessment Site  Shoulder    Right/Left Shoulder  Left    Left Shoulder Flexion  5/5   4+/5 previous   Left Shoulder ABduction  5/5   same as previous   Left Shoulder Internal Rotation  5/5   same as previous   Left Shoulder External Rotation  4+/5   same as previous              OT Treatments/Exercises (OP) - 06/20/19 1431      Exercises   Exercises  Shoulder      Shoulder Exercises: Supine  Protraction  PROM;5 reps    Horizontal ABduction  PROM;5 reps    External Rotation  PROM;5 reps    Internal Rotation  PROM;5 reps    Flexion  PROM;5 reps    ABduction  PROM;5 reps      Shoulder Exercises: Standing   External Rotation  Theraband;10 reps   shoulder abducted to 90, elbow flexed to 90   Theraband Level (Shoulder External Rotation)  Level 2 (Red)    Internal Rotation  Theraband;10 reps   shoulder and elbow flexed to 90   Theraband Level (Shoulder Internal Rotation)  Level 2 (Red)    Extension  Theraband;10 reps    Theraband Level (Shoulder Extension)  Level 2 (Red)    Row  Theraband;10 reps    Theraband Level (Shoulder Row)  Level 2 (Red)    Retraction  Theraband;10 reps    Theraband Level (Shoulder Retraction)  Level 2 (Red)      Shoulder Exercises: ROM/Strengthening   Other ROM/Strengthening Exercises  red loop band: wall slide with lift off, lateral wall slides 10X      Shoulder Exercises: Stretch   External Rotation Stretch  2 reps;30 seconds   at tabletop     Manual Therapy   Manual Therapy  Myofascial release    Manual therapy  comments  completed separately from therapeutic exercises    Myofascial Release  myofascial release to left upper arm and anterior shoulder to decrease pain and fascial restrictions and increase joint ROM             OT Education - 06/20/19 1500    Education Details  red loop band and A/ROM    Person(s) Educated  Patient    Methods  Explanation;Demonstration;Verbal cues;Handout    Comprehension  Returned demonstration;Verbalized understanding       OT Short Term Goals - 06/09/19 1003      OT SHORT TERM GOAL #1   Title  Patient will be educated and independent with HEP in order to faciliate progress in therapy and increase functional use of LUE during daily tasks.    Time  4    Period  Weeks    Status  On-going    Target Date  07/05/19      OT SHORT TERM GOAL #2   Title  Patient will increase LUE scapular and shoulder strength to 5/5 in order to return to full use of her LUE and be able to return to kayaking for several hours without complaints of discomfort.    Time  4    Period  Weeks    Status  On-going      OT SHORT TERM GOAL #3   Title  Patient will increase A/ROM of her LUE to WNL and return to completing all overhead, behind the back, and behind the back motions without difficulty.    Time  4    Period  Weeks    Status  On-going      OT SHORT TERM GOAL #4   Title  Patient will report a decrease in pain of approximately 2/10 or less during functional tasks when using her LUE.    Time  4    Period  Weeks    Status  On-going               Plan - 06/20/19 1546    Clinical Impression Statement  A: Pt reports feeling like she is progressing in therapy but it doesn't carry over at home. Discussed  progress and educated on timespan of shoulder recovery, measurements taken today with pt achieving ROM and strength WFL. Pt does continue to have pain and tightness at end range of flexion and er/IR. Added loop band exercises and tabletop er stretch today. Discussed  upcoming appts and pt would like to transition to 1x/week in preparation for upcoming reassessment and discharge. Verbal cuing for form and technique during exercises.    Body Structure / Function / Physical Skills  ADL;ROM;Strength;Pain    Plan  P: Follow up on HEP, continue with er/IR work and scapular/shoulder stability/strengthening    OT Home Exercise Plan  eval: shoulder stretches, er/IR with red theraband; 5/13: scapular theraband; 5/17: red loop band    Consulted and Agree with Plan of Care  Patient       Patient will benefit from skilled therapeutic intervention in order to improve the following deficits and impairments:   Body Structure / Function / Physical Skills: ADL, ROM, Strength, Pain       Visit Diagnosis: Other symptoms and signs involving the musculoskeletal system  Stiffness of left shoulder, not elsewhere classified  Acute pain of left shoulder    Problem List Patient Active Problem List   Diagnosis Date Noted  . Raynaud's syndrome 03/17/2019  . Vitamin D insufficiency 07/13/2016   Ezra Sites, OTR/L  [phone] 06/20/2019, 3:54 PM  [facility] [address] [place], [place], [address] Phone: [phone]   Fax:  [phone]  Name: Olivia Johnson MRN: [id number] Date of Birth: [birth year]

## 2019-06-23 ENCOUNTER — Ambulatory Visit (HOSPITAL_COMMUNITY): Payer: BC Managed Care – PPO | Admitting: Specialist

## 2019-06-27 ENCOUNTER — Encounter (HOSPITAL_COMMUNITY): Payer: BC Managed Care – PPO | Admitting: Occupational Therapy

## 2019-06-29 ENCOUNTER — Encounter (HOSPITAL_COMMUNITY): Payer: Self-pay | Admitting: Occupational Therapy

## 2019-06-29 ENCOUNTER — Ambulatory Visit (HOSPITAL_COMMUNITY): Payer: BC Managed Care – PPO | Admitting: Occupational Therapy

## 2019-06-29 ENCOUNTER — Other Ambulatory Visit: Payer: Self-pay

## 2019-06-29 DIAGNOSIS — R29898 Other symptoms and signs involving the musculoskeletal system: Secondary | ICD-10-CM | POA: Diagnosis not present

## 2019-06-29 DIAGNOSIS — M25512 Pain in left shoulder: Secondary | ICD-10-CM | POA: Diagnosis not present

## 2019-06-29 DIAGNOSIS — M25612 Stiffness of left shoulder, not elsewhere classified: Secondary | ICD-10-CM

## 2019-06-29 NOTE — Therapy (Signed)
[place], [place], [address] Phone: [phone]   Fax:  [phone]  Occupational Therapy Treatment  Patient Details  Name: Olivia Johnson MRN: [id number] Date of Birth: [birth year] Referring Provider (OT): Pearson Forster, NP   Encounter Date: 06/29/2019  OT End of Session - 06/29/19 1227    Visit Number  6    Number of Visits  8    Date for OT Re-Evaluation  07/05/19    Authorization Type  [employer] Commercial PPO    Authorization Time Period  60 visit limit combined. 0 used. No copay    Authorization - Visit Number  6    Authorization - Number of Visits  45    OT Start Time  2637    OT Stop Time  1157    OT Time Calculation (min)  40 min    Activity Tolerance  Patient tolerated treatment well    Behavior During Therapy  WFL for tasks assessed/performed       Past Medical History:  Diagnosis Date  . Raynaud's syndrome 03/17/2019   Has had this since teenager years mainly in the hands some in the toes    Past Surgical History:  Procedure Laterality Date  . COLONOSCOPY N/A 09/17/2016   Procedure: COLONOSCOPY;  Surgeon: Daneil Dolin, MD;  [facility];  Service: Endoscopy;  Laterality: N/A;  2:30 pm    There were no vitals filed for this visit.  Subjective Assessment - 06/29/19 1116    Subjective   S: I can push it further before it feels the same.    Currently in Pain?  No/denies    Pain Score  --   3/10 with movement   Multiple Pain Sites  No         [facility] OT Assessment - 06/29/19 1115      Assessment   Medical Diagnosis  left shoulder pain      Precautions   Precautions  None               OT Treatments/Exercises (OP) - 06/29/19 1118      Exercises   Exercises  Shoulder      Shoulder Exercises: Supine   Protraction  PROM;5 reps    Horizontal ABduction  PROM;5 reps    External Rotation  PROM;5 reps    Internal Rotation  PROM;5 reps    Flexion  PROM;5 reps     ABduction  PROM;5 reps      Shoulder Exercises: Standing   Horizontal ABduction  Theraband;10 reps    Theraband Level (Shoulder Horizontal ABduction)  Level 2 (Red)    External Rotation  Theraband;10 reps   shoulder abducted and elbow flexed to 90   Theraband Level (Shoulder External Rotation)  Level 2 (Red)    Internal Rotation  Theraband;10 reps   shoulder and elbow flexed to 90   Theraband Level (Shoulder Internal Rotation)  Level 2 (Red)    Extension  Theraband;10 reps    Theraband Level (Shoulder Extension)  Level 2 (Red)    Other Standing Exercises  arnold press, 10X, 1#; pt holding 1# weight at 90 degrees flexion & writing ABCs    Other Standing Exercises  overhead tricep extensions, 10X holding volleyball      Shoulder Exercises: ROM/Strengthening   Cybex Press  2 plate;10 reps   bodycraft   Wall Pushups  10 reps   diamond push-ups   Other ROM/Strengthening Exercises  ball  pass over shoulder for IR, 10X green weighted ball    Other ROM/Strengthening Exercises  bent over row (red theraband), bent over tricep kickbacks (3#), 10X each       Shoulder Exercises: Stretch   External Rotation Stretch  2 reps;30 seconds   at tabletop     Modalities   Modalities  Ultrasound      Ultrasound   Ultrasound Location  anterior shoulder    Ultrasound Parameters  1.5 w/cm2    Ultrasound Goals  Pain               OT Short Term Goals - 06/09/19 1003      OT SHORT TERM GOAL #1   Title  Patient will be educated and independent with HEP in order to faciliate progress in therapy and increase functional use of LUE during daily tasks.    Time  4    Period  Weeks    Status  On-going    Target Date  07/05/19      OT SHORT TERM GOAL #2   Title  Patient will increase LUE scapular and shoulder strength to 5/5 in order to return to full use of her LUE and be able to return to kayaking for several hours without complaints of discomfort.    Time  4    Period  Weeks    Status   On-going      OT SHORT TERM GOAL #3   Title  Patient will increase A/ROM of her LUE to WNL and return to completing all overhead, behind the back, and behind the back motions without difficulty.    Time  4    Period  Weeks    Status  On-going      OT SHORT TERM GOAL #4   Title  Patient will report a decrease in pain of approximately 2/10 or less during functional tasks when using her LUE.    Time  4    Period  Weeks    Status  On-going               Plan - 06/29/19 1227    Clinical Impression Statement  A: Pt reports improvement in mobility and is able to push arm farther into er/IR before pain begins. Began session with [place] at anterior shoulder with ROM WNL during passive stretching immediately after [place]. Session incorporating tricep strengthening/stretching to address pain at tricep tendon at posterior shoulder region. Also completing shoulder strengthening and stability today. Pt with fatigue during controlled tasks requiring sustained tricep activation. Verbal cuing for form and technique.    Body Structure / Function / Physical Skills  ADL;ROM;Strength;Pain    Plan  P: reassess and discharge with customized HEP-see Enma Maeda    OT Home Exercise Plan  eval: shoulder stretches, er/IR with red theraband; 5/13: scapular theraband; 5/17: red loop band    Consulted and Agree with Plan of Care  Patient       Patient will benefit from skilled therapeutic intervention in order to improve the following deficits and impairments:   Body Structure / Function / Physical Skills: ADL, ROM, Strength, Pain       Visit Diagnosis: Other symptoms and signs involving the musculoskeletal system  Stiffness of left shoulder, not elsewhere classified  Acute pain of left shoulder    Problem List Patient Active Problem List   Diagnosis Date Noted  . Raynaud's syndrome 03/17/2019  . Vitamin D insufficiency 07/13/2016   Ezra Sites, OTR/L  [phone] 06/29/2019,  12:30 PM  [facility] [address] [place], [place], [address] Phone: [phone]   Fax:  [phone]  Name: Olivia Johnson MRN: [id number] Date of Birth: 1965/03/27

## 2019-07-05 ENCOUNTER — Other Ambulatory Visit: Payer: Self-pay

## 2019-07-05 ENCOUNTER — Encounter (HOSPITAL_COMMUNITY): Payer: Self-pay | Admitting: Occupational Therapy

## 2019-07-05 ENCOUNTER — Ambulatory Visit (HOSPITAL_COMMUNITY): Payer: BC Managed Care – PPO | Attending: Nurse Practitioner | Admitting: Occupational Therapy

## 2019-07-05 DIAGNOSIS — R29898 Other symptoms and signs involving the musculoskeletal system: Secondary | ICD-10-CM | POA: Diagnosis not present

## 2019-07-05 DIAGNOSIS — M25612 Stiffness of left shoulder, not elsewhere classified: Secondary | ICD-10-CM | POA: Diagnosis not present

## 2019-07-05 DIAGNOSIS — M25512 Pain in left shoulder: Secondary | ICD-10-CM | POA: Insufficient documentation

## 2019-07-05 NOTE — Patient Instructions (Addendum)
Triceps Exercises:  10x, 1x/day. Stretches: 30" holds, 3x  1) Triceps Stretch with Towel  Place the towel in the hand of the target arm with your arm above your head as shown. Use the other hand to pull downward on the towel, allowing the elbow to bend until a stretch is felt on the back of on the top arm.    2) Triceps Stretch Behind Head Stand with affected arm up and behind head, reaching down the back as far as comfortable. Use free hand to press elbow backwards stretching the triceps muscle.    3) Triceps Extension with Band Start with your elbows bent and holding an elastic band as shown. Pull the elastic band downward as you extend your elbows. Keep your elbows by your side the entire time.    4) Triceps Kickbacks While standing, bend over and support yourself with your uninjured arm. With your affected arm, hold a free weight / dumbbell and keep your elbow at your side with it in a bent position.  Start by extending your elbow as you straighten your arm and lift the weight as shown. Return to starting position and repeat. Keep your elbow at your side the entire time.   5) Overhead Triceps Extension - Sit or stand with dumbbell held overhead. Bend elbows to lower weight. Try to keep elbows pointing towards ceiling.     6) Triceps Dips Move to the edge of a chair or bench. Keeping your knees bent slowly lower your hips towards the ground, then push yourself up by straightening your arms.      Additional Exercises: 10x each, 1x/day  7) 90/90 External Rotation with Band Start by holding an elastic band or sports cord with your arm up at 90 degrees flexed forward and elbow bent at 90 degrees. Next, bring your forearm upward so that it points towards the ceiling as shown.   8) 90/90 Internal Rotation with Band Start by holding an elastic band or sports cord with your arm up at 90 degrees away from your side and elbow bent to 90 degrees. Next, roll your shoulder forward so  that your forearm become horizontal to the floor. Return to original position and repeat.               Additional Stretches: Hold for 30", 3x each  9) Pectoralis Stretch While standing at a corner or in a doorway, place your arms on the walls with elbows bent so that your upper arms are horizontal and your forearms are directed upwards as shown. Take one step forward towards the corner. Bend your front knee until a stretch is felt along the front of your chest and/or shoulders. Your arms should be pointed downward towards the ground. Hold for 30 seconds, 3x.     10) External Rotation Stretch at Tabletop While sitting, rest your forearm on a table keeping shoulder at 90 degrees, and lean forward until a stretch is felt. Hold for 30 seconds, 3x.      Other Exercises:  1) ABC writing holding 1-2# weight at 90 degrees shoulder flexion 2) Diamond wall push-ups

## 2019-07-05 NOTE — Therapy (Signed)
[place], [place], [address] Phone: [phone]   Fax:  [phone]  Occupational Therapy Reassessment, Treatment, Discharge Summary  Patient Details  Name: CATIA TODOROV MRN: [id number] Date of Birth: October 17, 1965 Referring Provider (OT): Pearson Forster, NP   Encounter Date: 07/05/2019  OT End of Session - 07/05/19 0946    Visit Number  7    Number of Visits  8    Date for OT Re-Evaluation  07/05/19    Authorization Type  [employer] P[employer]    Authorization Time Period  60 visit limit combined. 0 used. No copay    Authorization - Visit Number  7    Authorization - Number of Visits  12    OT Start Time  0859    OT Stop Time  [id number]    OT Time Calculation (min)  38 min    Activity Tolerance  Patient tolerated treatment well    Behavior During Therapy  WFL for tasks assessed/performed       Past Medical History:  Diagnosis Date  . Raynaud's syndrome 03/17/2019   Has had this since teenager years mainly in the hands some in the toes    Past Surgical History:  Procedure Laterality Date  . COLONOSCOPY N/A 09/17/2016   Procedure: COLONOSCOPY;  Surgeon: Daneil Dolin, MD;  Location: [facility];  Service: Endoscopy;  Laterality: N/A;  2:30 pm    There were no vitals filed for this visit.  Subjective Assessment - 07/05/19 0858    Subjective   S: It's pretty much the same today.    Currently in Pain?  No/denies    Pain Score  --   3/10 with movement   Multiple Pain Sites  No         [facility] OT Assessment - 07/05/19 0858      Assessment   Medical Diagnosis  left shoulder pain      Precautions   Precautions  None      AROM   Overall AROM Comments  Assessed standing. IR/er abducted    AROM Assessment Site  Shoulder    Right/Left Shoulder  Left    Left Shoulder Flexion  180 Degrees   171 previous   Left Shoulder ABduction  180 Degrees   same as previous   Left Shoulder Internal Rotation  70  Degrees   same as previous   Left Shoulder External Rotation  82 Degrees   78 previous     PROM   Overall PROM Comments  Assessed supine. IR/er abducted    PROM Assessment Site  Shoulder    Right/Left Shoulder  Left    Left Shoulder Flexion  180 Degrees   170 previous   Left Shoulder ABduction  180 Degrees   same as previous   Left Shoulder Internal Rotation  90 Degrees   same as previous   Left Shoulder External Rotation  72 Degrees   60 previous     Strength   Overall Strength Comments  Assessed standing. IR/er abducted    Strength Assessment Site  Shoulder    Right/Left Shoulder  Left    Left Shoulder Flexion  5/5   same as previous   Left Shoulder ABduction  5/5   same as previous   Left Shoulder Internal Rotation  5/5   same as previous   Left Shoulder External Rotation  4+/5   same as previous  OT Treatments/Exercises (OP) - 07/05/19 0900      Exercises   Exercises  Shoulder      Shoulder Exercises: Supine   Protraction  PROM;5 reps    Horizontal ABduction  PROM;5 reps    External Rotation  PROM;5 reps    Internal Rotation  PROM;5 reps    Flexion  PROM;5 reps    ABduction  PROM;5 reps      Shoulder Exercises: Standing   Other Standing Exercises  red loop band-wall slides with lift off and lateral wall slides, 10X each    Other Standing Exercises  ABC writing with 3# weight, arm at 90 degrees flexion      Shoulder Exercises: ROM/Strengthening   Wall Pushups  10 reps   diamond wall push-ups   Other ROM/Strengthening Exercises  ball pass over shoulder for IR, 10X green weighted ball    Other ROM/Strengthening Exercises  Tricep kickbacks with 3#, tricep dips, tricep extension with green band, 10X each      Shoulder Exercises: Stretch   Corner Stretch  2 reps;30 seconds    Other Shoulder Stretches  Tricep stretch with towel, 2x, 30" holds    Other Shoulder Stretches  Tricep stretch behind head, 2x, 30"             OT Education  - 07/05/19 0944    Education Details  tricep and shoulder stability exercises    Person(s) Educated  Patient    Methods  Explanation;Demonstration;Verbal cues;Handout    Comprehension  Returned demonstration;Verbalized understanding       OT Short Term Goals - 07/05/19 0946      OT SHORT TERM GOAL #1   Title  Patient will be educated and independent with HEP in order to faciliate progress in therapy and increase functional use of LUE during daily tasks.    Time  4    Period  Weeks    Status  Achieved    Target Date  07/05/19      OT SHORT TERM GOAL #2   Title  Patient will increase LUE scapular and shoulder strength to 5/5 in order to return to full use of her LUE and be able to return to kayaking for several hours without complaints of discomfort.    Time  4    Period  Weeks    Status  Partially Met      OT SHORT TERM GOAL #3   Title  Patient will increase A/ROM of her LUE to WNL and return to completing all overhead, behind the back, and behind the back motions without difficulty.    Time  4    Period  Weeks    Status  Achieved      OT SHORT TERM GOAL #4   Title  Patient will report a decrease in pain of approximately 2/10 or less during functional tasks when using her LUE.    Time  4    Period  Weeks    Status  Achieved               Plan - 07/05/19 0946    Clinical Impression Statement  A: Reassessment completed this session, pt has met 3/4 goals with remaining goal partially met. Pt has made good progress with mobility and strength, demonstrating mobility WNL and strength WNL with left er being slightly weaker than RUE. Pt reports it has been 2+ weeks since she has had any random sharp pains in her arm. Continues to feel pulling/disomfort with  end range stretch or lifting tasks. Pt able to perform ADLs using LUE, does have difficulty pronating arm behind back when reaching towards shoulder blade. Pt is agreeable to discharge with HEP today.    Body Structure /  Function / Physical Skills  ADL;ROM;Strength;Pain    Plan  P: Discharge pt    OT Home Exercise Plan  eval: shoulder stretches, er/IR with red theraband; 5/13: scapular theraband; 5/17: red loop band; 6/1: tricep work, shoulder stability work    Oncologist with Plan of Care  Patient       Patient will benefit from skilled therapeutic intervention in order to improve the following deficits and impairments:   Body Structure / Function / Physical Skills: ADL, ROM, Strength, Pain       Visit Diagnosis: Other symptoms and signs involving the musculoskeletal system  Stiffness of left shoulder, not elsewhere classified  Acute pain of left shoulder    Problem List Patient Active Problem List   Diagnosis Date Noted  . Raynaud's syndrome 03/17/2019  . Vitamin D insufficiency 07/13/2016   Guadelupe Sabin, OTR/L  [phone] 07/05/2019, 9:49 AM  [facility] [address] [place], [place], [address] Phone: [phone]   Fax:  [phone]  Name: ERICCA LABRA MRN: [id number] Date of Birth: November 07, 1965   OCCUPATIONAL THERAPY DISCHARGE SUMMARY  Visits from Start of Care: 7  Current functional level related to goals / functional outcomes: See above. Pt with good improvements in ROM and strength, is able to use LUE functionally.    Remaining deficits: Slight ROM limitation with er and IR at end range. Occasional pain and soreness with sustained use   Education / Equipment: HEP for shoulder stability and strengthening, tricep work  Plan: Patient agrees to discharge.  Patient goals were met. Patient is being discharged due to meeting the stated rehab goals.  ?????

## 2019-07-08 ENCOUNTER — Encounter (HOSPITAL_COMMUNITY): Payer: BC Managed Care – PPO | Admitting: Specialist

## 2019-09-06 DIAGNOSIS — Z20822 Contact with and (suspected) exposure to covid-19: Secondary | ICD-10-CM | POA: Diagnosis not present

## 2020-04-17 IMAGING — DX DG SHOULDER 2+V*L*
3 series · 3 of 3 positions shown · non-contrast
Comparison: None.

CLINICAL DATA: Left shoulder pain for 1 year

EXAM:
LEFT SHOULDER - 2+ VIEW

[shoulder grashey]
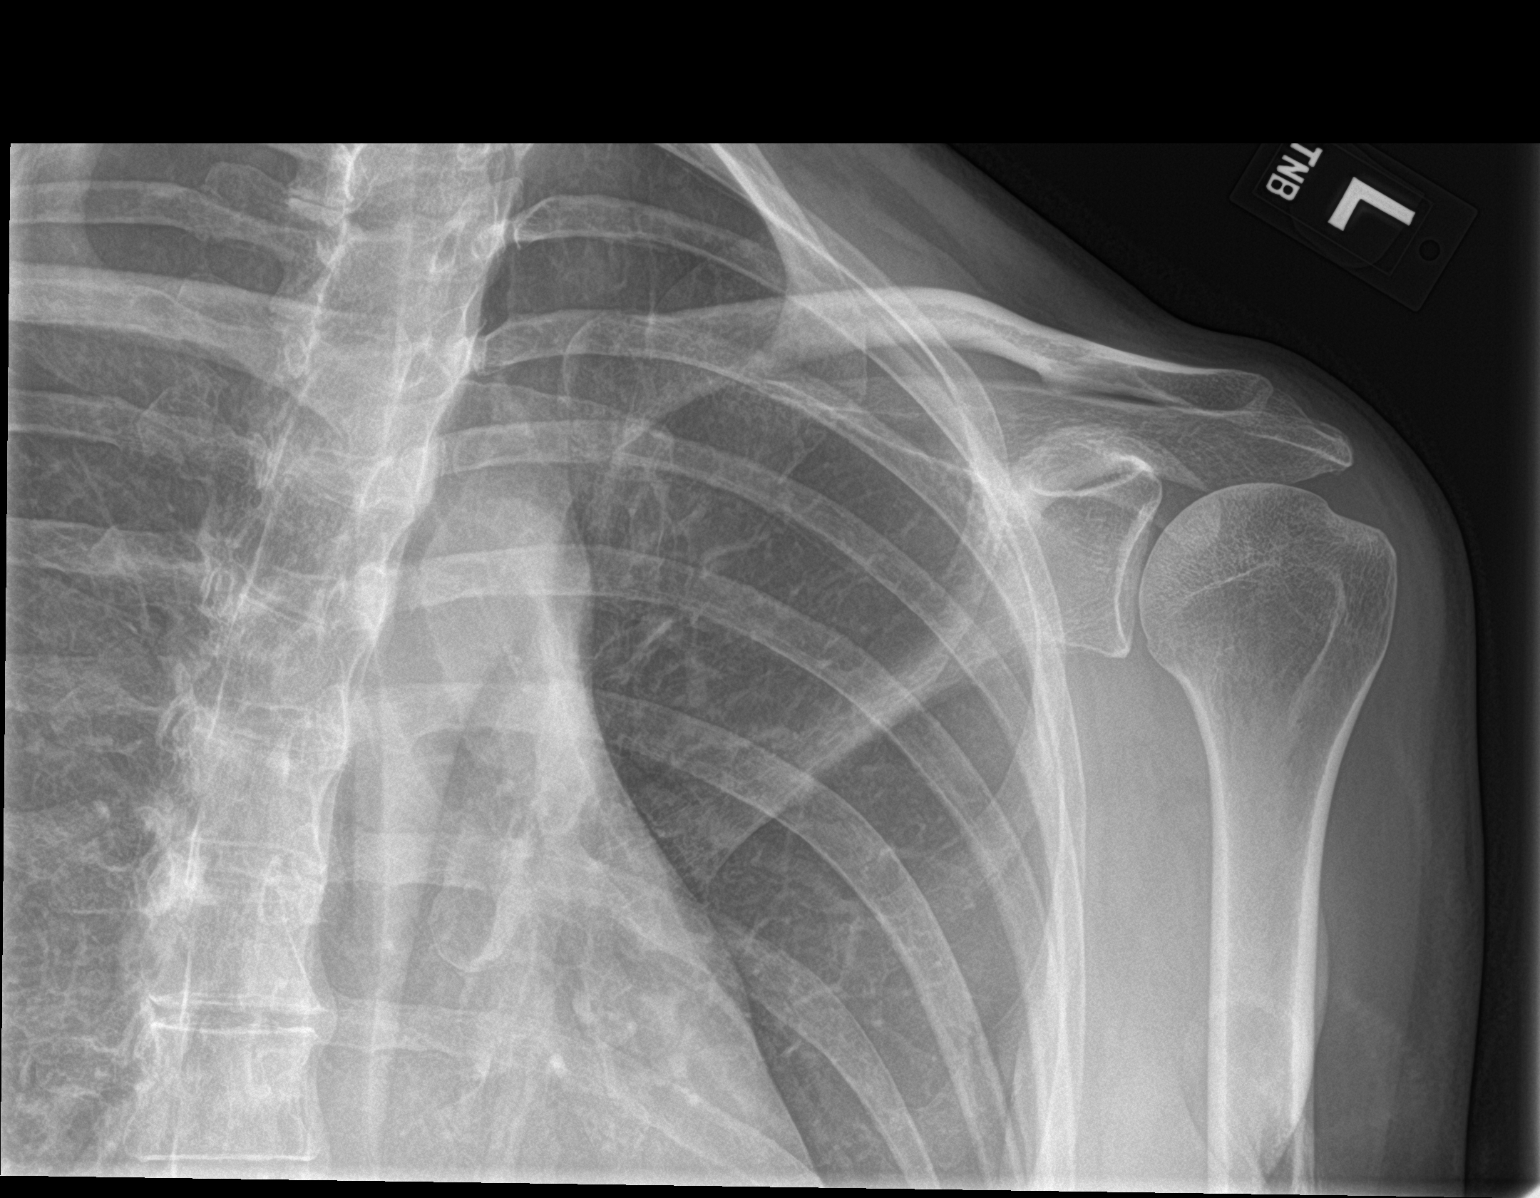

[shoulder y view]
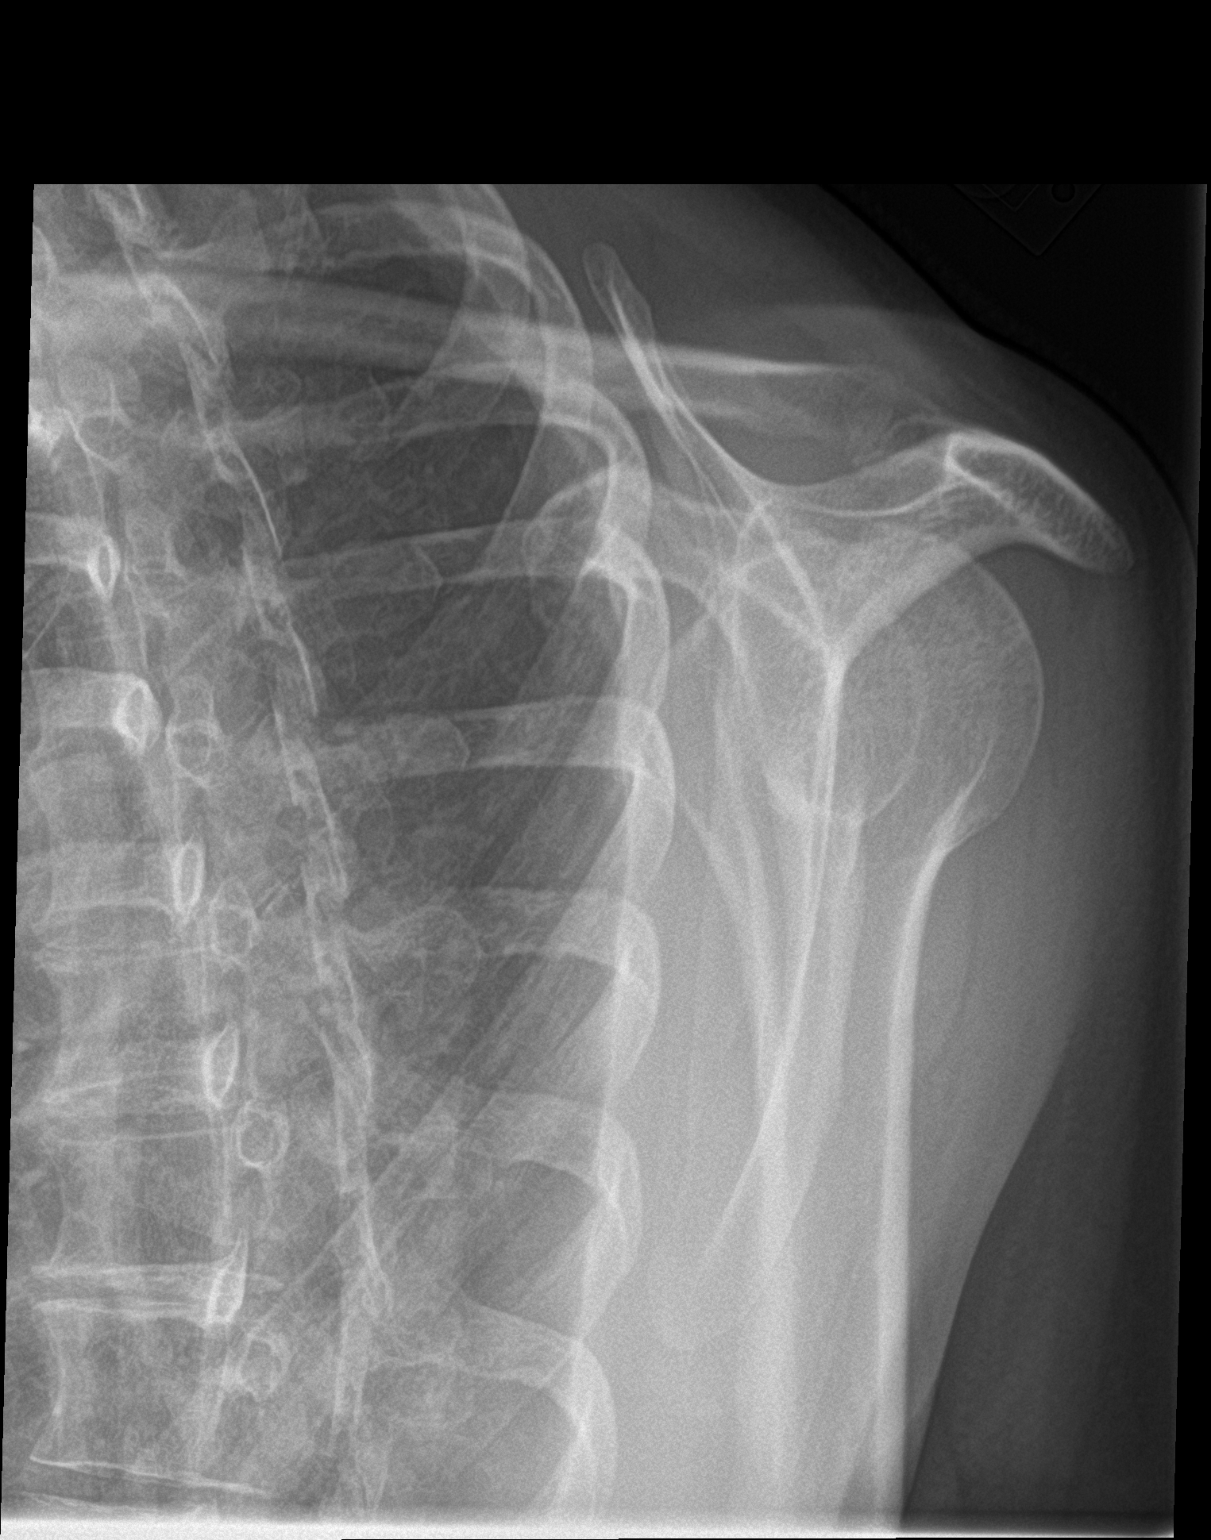

[shoulder axillary]
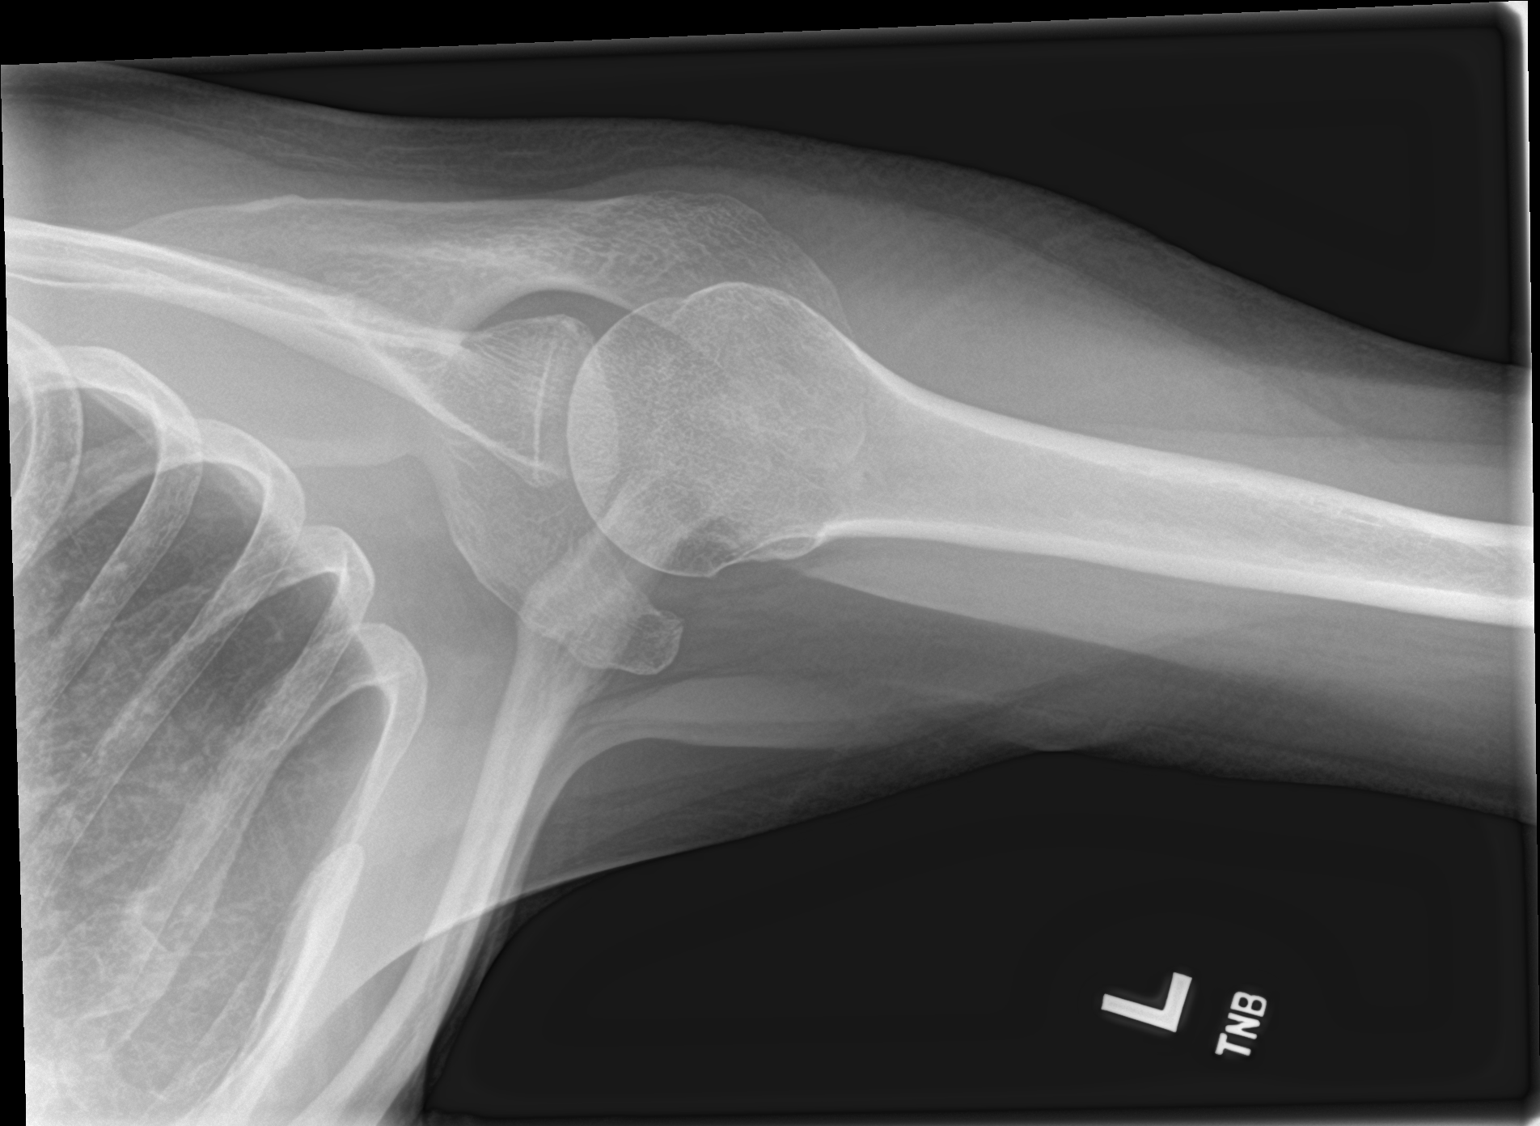

[3 of 3 positions shown; findings below may reference images not displayed]

FINDINGS: There is no evidence of fracture or dislocation. There is no
evidence of arthropathy or other focal bone abnormality. Soft
tissues are unremarkable.
IMPRESSION: No acute abnormality noted.

## 2020-04-17 IMAGING — MG DIGITAL SCREENING BILAT W/ TOMO W/ CAD
8 series · 9 of 24 positions shown · non-contrast
Comparison: Previous exam(s).

CLINICAL DATA: Screening.

EXAM:
DIGITAL SCREENING BILATERAL MAMMOGRAM WITH TOMO AND CAD

[R MLO synth-2D]
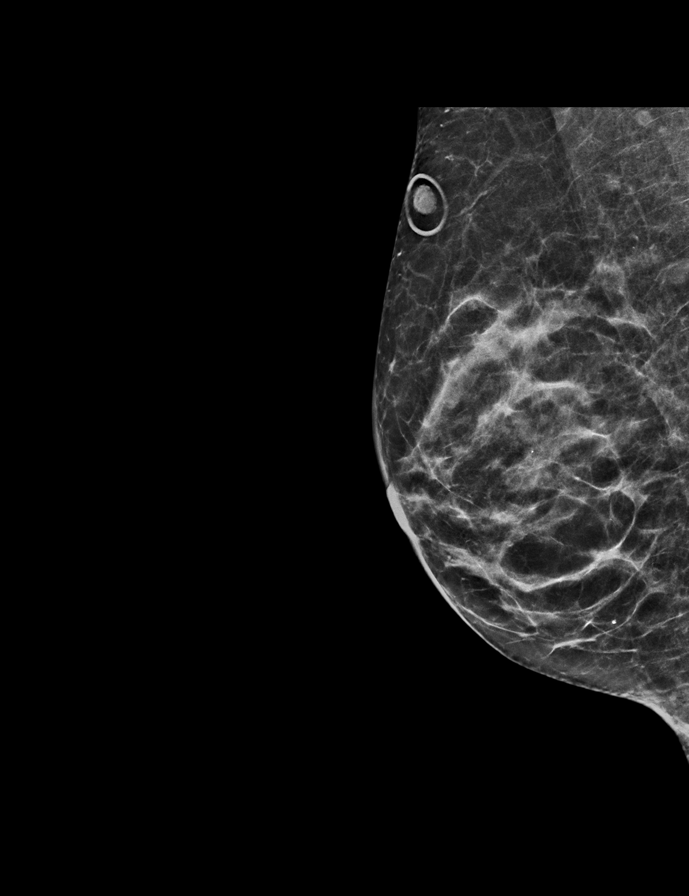

[L MLO synth-2D]
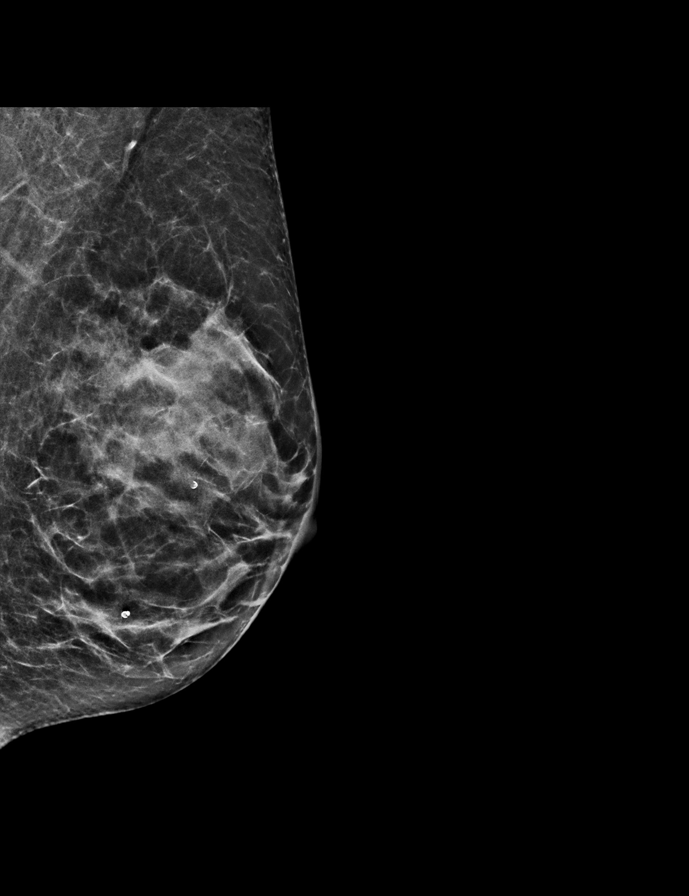

[R CC synth-2D]
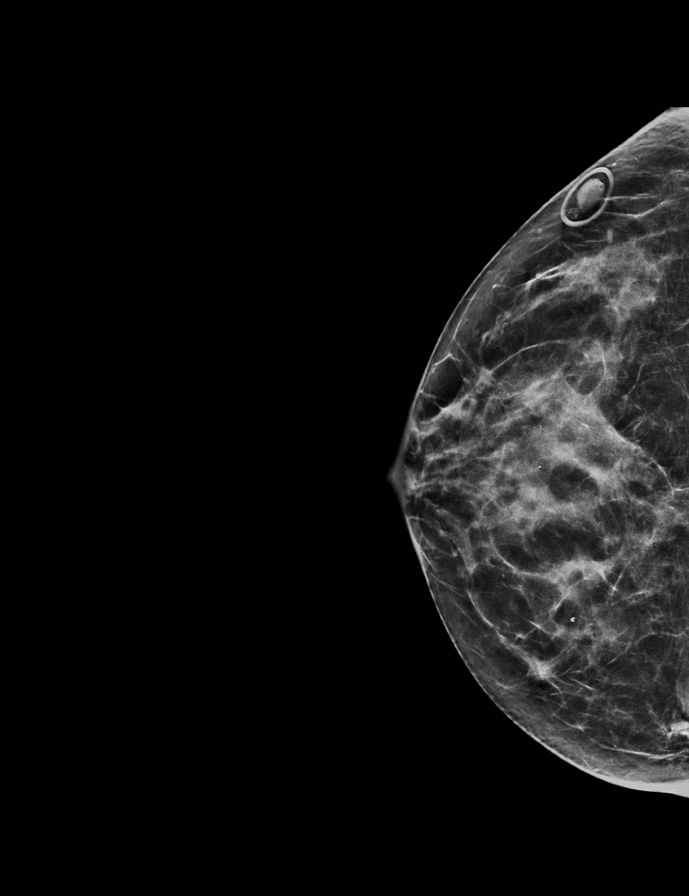

[L CC synth-2D]
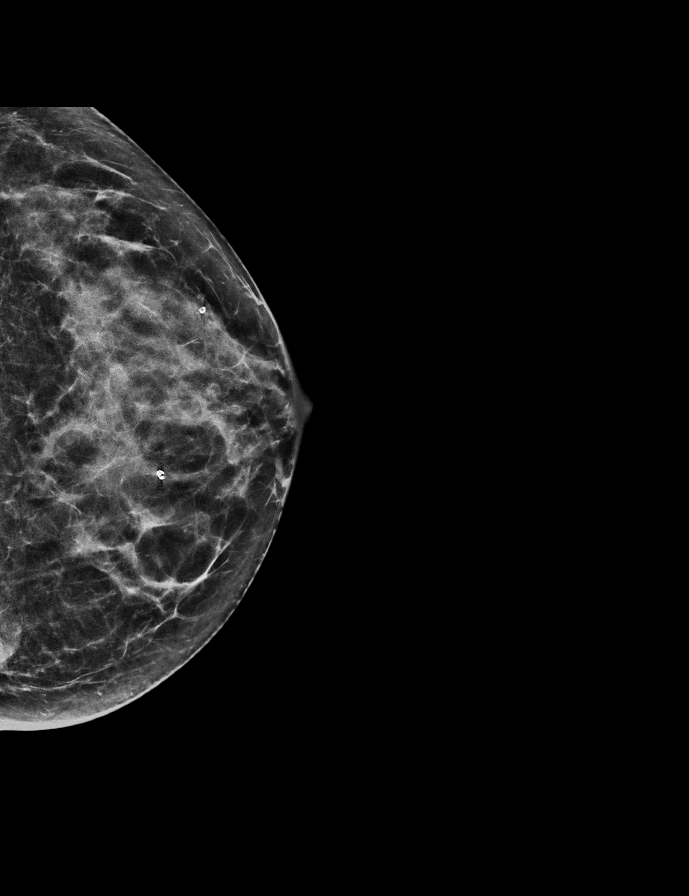

[R CC tomo · 2 of 55 frames shown]
[frame 18/55]
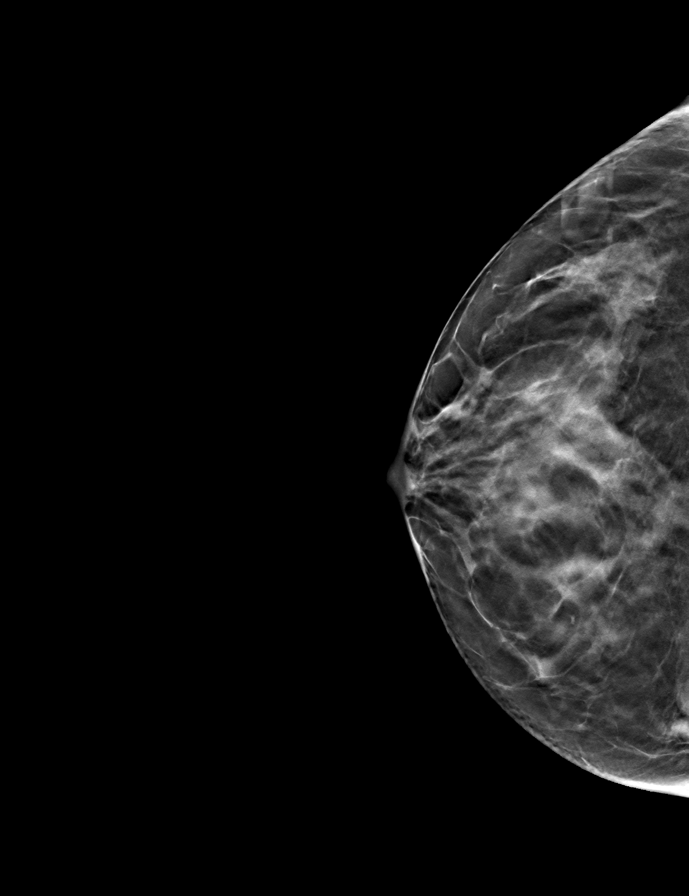
[frame 28/55]
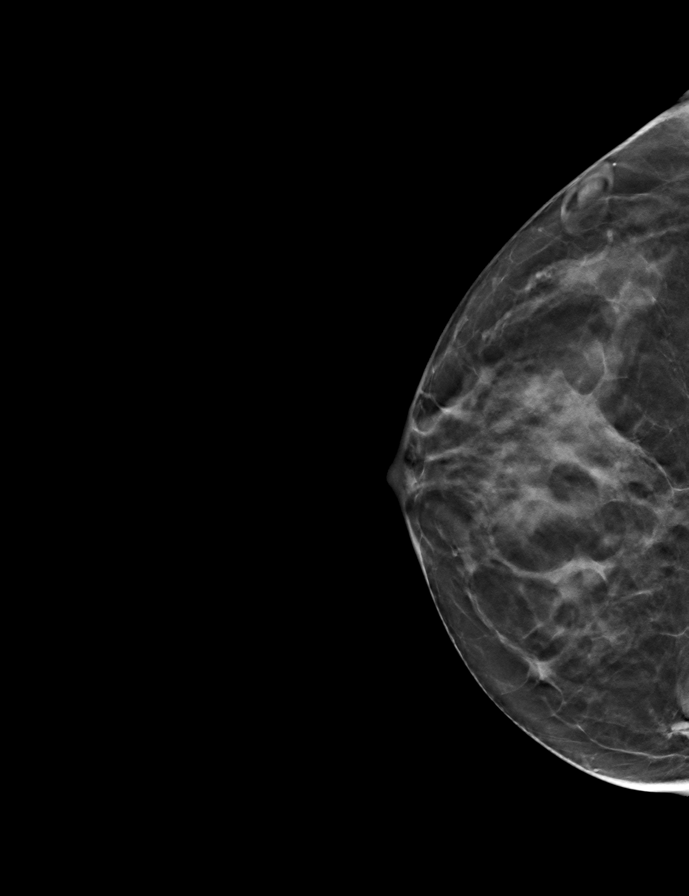

[L MLO tomo · tomo slice 26/51.0]
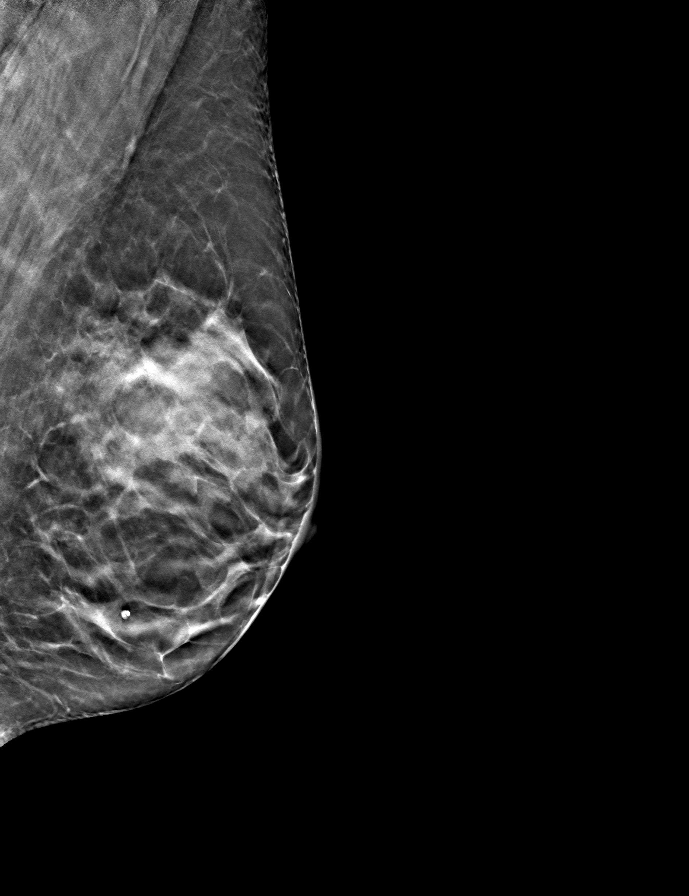

[L CC tomo · tomo slice 27/53.0]
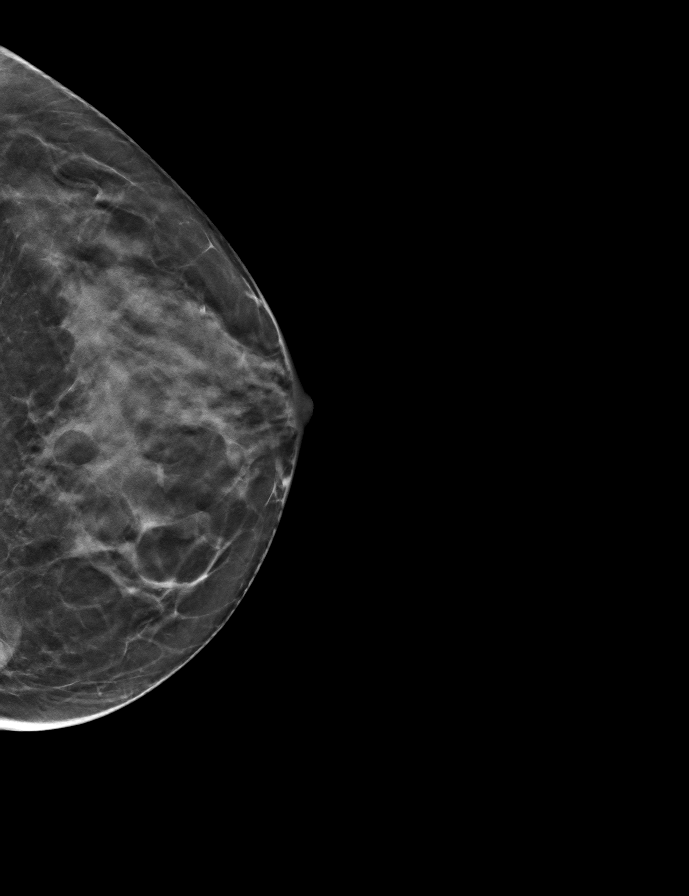

[R MLO tomo · tomo slice 27/53.0]
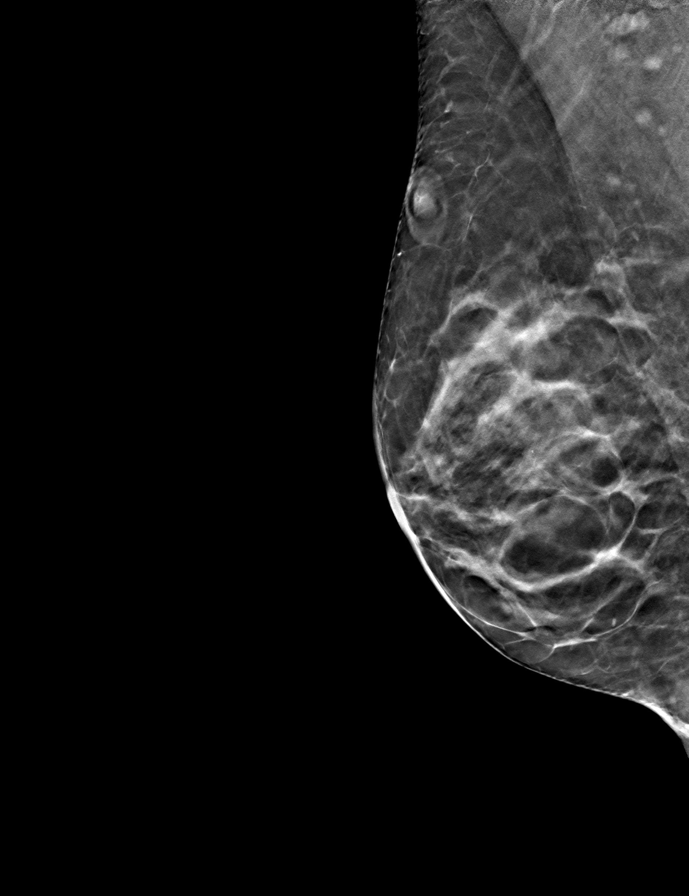

[9 of 24 positions shown; findings below may reference images not displayed]

ACR Breast Density Category c: The breast tissue is heterogeneously
dense, which may obscure small masses.
FINDINGS: There are no findings suspicious for malignancy. Images were
processed with CAD.
IMPRESSION: No mammographic evidence of malignancy. A result letter of this
screening mammogram will be mailed directly to the patient.

RECOMMENDATION:
Screening mammogram in one year. (Code:[id number])

BI-RADS CATEGORY  1: Negative.

## 2023-01-06 ENCOUNTER — Other Ambulatory Visit: Payer: Self-pay | Admitting: *Deleted

## 2023-01-06 DIAGNOSIS — E785 Hyperlipidemia, unspecified: Secondary | ICD-10-CM

## 2023-01-23 ENCOUNTER — Ambulatory Visit (HOSPITAL_COMMUNITY)
Admission: RE | Admit: 2023-01-23 | Discharge: 2023-01-23 | Disposition: A | Discharge status: Home / Self Care | Payer: Self-pay | Source: Ambulatory Visit | Attending: Family Medicine | Admitting: Family Medicine

## 2023-01-23 DIAGNOSIS — E785 Hyperlipidemia, unspecified: Secondary | ICD-10-CM | POA: Insufficient documentation

## 2023-01-25 ENCOUNTER — Encounter: Payer: Self-pay | Admitting: Family Medicine

## 2023-01-27 NOTE — Addendum Note (Signed)
Addended by: Margaretha Sheffield on: 01/27/2023 09:23 AM   Modules accepted: Orders

## 2023-02-11 ENCOUNTER — Ambulatory Visit (INDEPENDENT_AMBULATORY_CARE_PROVIDER_SITE_OTHER): Payer: Managed Care, Other (non HMO) | Admitting: Family Medicine

## 2023-02-11 VITALS — BP 104/73 | HR 89 | Temp 98.2°F | Ht 67.5 in

## 2023-02-11 DIAGNOSIS — R911 Solitary pulmonary nodule: Secondary | ICD-10-CM

## 2023-02-11 DIAGNOSIS — M25561 Pain in right knee: Secondary | ICD-10-CM

## 2023-02-11 DIAGNOSIS — Z79899 Other long term (current) drug therapy: Secondary | ICD-10-CM

## 2023-02-11 DIAGNOSIS — M545 Low back pain, unspecified: Secondary | ICD-10-CM

## 2023-02-11 DIAGNOSIS — R931 Abnormal findings on diagnostic imaging of heart and coronary circulation: Secondary | ICD-10-CM

## 2023-02-11 DIAGNOSIS — G609 Hereditary and idiopathic neuropathy, unspecified: Secondary | ICD-10-CM

## 2023-02-11 DIAGNOSIS — E785 Hyperlipidemia, unspecified: Secondary | ICD-10-CM

## 2023-02-11 DIAGNOSIS — D229 Melanocytic nevi, unspecified: Secondary | ICD-10-CM

## 2023-02-11 DIAGNOSIS — R197 Diarrhea, unspecified: Secondary | ICD-10-CM

## 2023-02-11 NOTE — Progress Notes (Signed)
 Subjective:    Patient ID: Olivia Johnson, female    DOB: 12-17-1965, 58 y.o.   MRN: [id number]  Discussed the use of AI scribe software for clinical note transcription with the patient, who gave verbal consent to proceed. Very nice patient Here today with specifically a follow-up regarding coronary calcium  atherosclerosis and pulmonary nodule Also has multiple other issues that she would like to touch on briefly and do a follow-up visit History of Present Illness   The patient, a 58 year old with a recent coronary calcium  scan revealing some increased coronary calcium  in the left anterior descending artery, presents for a follow-up consultation. The patient denies any cardiac symptoms, such as chest pressure, tightness, pain, or discomfort during physical activity or daily tasks. However, she reports a significant change in activity level due to caring for her baby granddaughter. The patient also notes a persistently high heart rate, often in the 110s to 120s, particularly during physical exertion.  The patient has been experiencing progressive numbness, described as a tingling sensation, in all toes extending into the first portion of the foot. This symptom has been present for several years and has recently progressed. The patient denies any family history of neuropathy and has no known history of diabetes.  Additionally, the patient reports chronic soreness in the back, specifically between two vertebrae. The discomfort has been present since the previous summer and is described as a constant soreness, exacerbated by certain movements and postures. The patient also mentions a decrease in flexibility due to this discomfort.  The patient also reports a history of plantar fasciitis, which has been causing some difficulty in her preparation for an upcoming hiking trip. She has been managing the condition with stretching exercises and has noticed some improvement.  Lastly, the patient has been  dealing with gastrointestinal symptoms, primarily loose stools, which she attributes to her diet. She has recently made dietary changes, including reducing sugar and processed food intake, which has led to some improvement in her symptoms. However, she still experiences discomfort, particularly with an increase in beans in her diet. The patient denies any intolerance to meats.         Review of Systems     Objective:    Physical Exam   SKIN: Presence of a mole with multiple colors located just off the spine.     Lungs are clear hearts regular extremities no edema low back subjective tenderness over the lower lumbar spine region Patient does have a atypical mole noted on the right lower back The patient was shown a picture of this and then the picture was deleted      Assessment & Plan:  Assessment and Plan    Coronary Artery Disease Coronary calcium  scan shows some buildup in the left anterior descending artery, correlating to approximately 20-25% blockage. No current symptoms of chest pain or discomfort with physical activity. Discussed the importance of lowering LDL cholesterol to reduce future risk of heart attack. -Order lipid panel to assess current cholesterol levels. -Start Rosuvastatin  10mg  three times a week, pending lipid panel results.  Pulmonary Nodule Small right lower lobe pulmonary nodule (8x56mm) identified on scan. No current symptoms or high-risk factors (e.g., smoking). Discussed the need for follow-up imaging to monitor for changes. -Schedule follow-up CT scan in 6 months (June/July 2025).  Peripheral Neuropathy Reports of tingling in all toes and first portion of foot. No known family history of neuropathy. Discussed potential causes and the need for further testing. -Order blood work to  assess for potential causes (e.g., B12, thyroid, A1C). -Consider referral to neurology for nerve conduction studies if blood work is unremarkable.  Back Pain Chronic soreness  in the back, improved with arching. No locking or giving way suggestive of cartilage issue. Discussed potential benefit of physical therapy and possible need for imaging. -Consider ordering x-ray of the back. -Refer to physical therapy for back pain and plantar fasciitis.  Skin Lesion Identified a mole with multiple colors on the back. Discussed the need for dermatology evaluation. -Refer to dermatology for evaluation of skin lesion.  Hyperlipidemia Previous LDL cholesterol level of 125, with a good HDL level of 65. Discussed the need to lower LDL to below 70, preferably below 55, to reduce risk of coronary artery disease. -Order lipid panel to assess current cholesterol levels. -Start Rosuvastatin  10mg  three times a week, pending lipid panel results.  Irritable Bowel Syndrome Ongoing issues with IBS, with a family history of gastrointestinal issues. Discussed the need for further evaluation. -Consider referral to gastroenterology for further evaluation.     1. Hyperlipidemia, unspecified hyperlipidemia type The goal is to get LDL below 70 and if possible below 55 she will do lab work we await the results - Lipid Panel  2. Idiopathic peripheral neuropathy (Primary) She has neuropathy in her toes all the way up to the MTP joints, lab work ordered, nerve conduction study would unlikely be strongly informative at this point but patient states this is progressive if the labs do not point to enough reasons we will offer her consultation with neurology with nerve conduction study - Hemoglobin A1c - Vitamin B12 - Methylmalonic Acid, Serum - Ambulatory referral to Physical Therapy  3. Elevated coronary artery calcium  score Although she is in 91st percentile her coronary calcium  score is not severely high we did discuss the importance of healthy eating regular physical activity and keeping LDL below 70 and if possible below 55 patient hesitant to go on to statins but we did discuss tolerability  side effects and the strong likelihood she will be able to tolerate the medicine, compared to not treating her coronaries patient opts to go ahead and start statin but this is contingent on her doing her blood work then if she starts a medicine we will do lab work in 3 months time to relook at cholesterol and liver function  4. Pulmonary nodule Patient not at high risk but because this is a new finding it is recommended to do another imaging in 6 months time we will put this into the reminder file  5. Right knee pain, unspecified chronicity Patient complains of medial pain in the right knee hurts more so when she squats sometimes have to use different motions to stand up from a squat she denies any locking or giving way physical therapy ordered  6. Lumbar pain Patient with low back pain discomfort no radiation down the legs this been going on for period of time hurts with certain movements I have encouraged the patient do some stretching we are set up physical therapy check lab hold off on x-rays - C-reactive protein - Ambulatory referral to Physical Therapy  7. Diarrhea, unspecified type Loose stools may well be irritable bowel syndrome patient states sometimes occurs after eating denies hives wheezing or difficulty breathing but she has been bit by several ticks in the past - Tissue Transglutaminase Abs,IgG,IgA - Alpha-Gal Panel  8. High risk medication use Labs ordered - Basic Metabolic Panel - Hepatic Function Panel  9. Atypical mole Referral  to dermatology she has a atypical mole of the right lower back with multiple colors need to rule out melanoma-likelihood of this is low but needs thorough evaluation   Patient will do a follow-up visit in approximately 3 months sooner if any ongoing troubles we will dive into other issues in more detail at that time

## 2023-02-12 ENCOUNTER — Other Ambulatory Visit: Payer: Self-pay

## 2023-02-12 DIAGNOSIS — D229 Melanocytic nevi, unspecified: Secondary | ICD-10-CM

## 2023-02-14 LAB — BASIC METABOLIC PANEL
BUN/Creatinine Ratio: 19 (ref 9–23)
BUN: 13 mg/dL (ref 6–24)
CO2: 23 mmol/L (ref 20–29)
Calcium: 9.6 mg/dL (ref 8.7–10.2)
Chloride: 104 mmol/L (ref 96–106)
Creatinine, Ser: 0.7 mg/dL (ref 0.57–1.00)
Glucose: 77 mg/dL (ref 70–99)
Potassium: 4.6 mmol/L (ref 3.5–5.2)
Sodium: 144 mmol/L (ref 134–144)
eGFR: 100 mL/min/{1.73_m2} (ref >59)

## 2023-02-14 LAB — HEPATIC FUNCTION PANEL
ALT: 17 [IU]/L (ref 0–32)
AST: 18 [IU]/L (ref 0–40)
Albumin: 5 g/dL — ABNORMAL HIGH (ref 3.8–4.9)
Alkaline Phosphatase: 81 [IU]/L (ref 44–121)
Bilirubin Total: 0.3 mg/dL (ref 0.0–1.2)
Bilirubin, Direct: 0.14 mg/dL (ref 0.00–0.40)
Total Protein: 7 g/dL (ref 6.0–8.5)

## 2023-02-14 LAB — LIPID PANEL
Chol/HDL Ratio: 3.3 {ratio} (ref 0.0–4.4)
Cholesterol, Total: 221 mg/dL — ABNORMAL HIGH (ref 100–199)
HDL: 67 mg/dL (ref >39)
LDL Chol Calc (NIH): 129 mg/dL — ABNORMAL HIGH (ref 0–99)
Triglycerides: 144 mg/dL (ref 0–149)
VLDL Cholesterol Cal: 25 mg/dL (ref 5–40)

## 2023-02-14 LAB — HEMOGLOBIN A1C
Est. average glucose Bld gHb Est-mCnc: 117 mg/dL
Hgb A1c MFr Bld: 5.7 % — ABNORMAL HIGH (ref 4.8–5.6)

## 2023-02-14 LAB — TISSUE TRANSGLUTAMINASE ABS,IGG,IGA
Tissue Transglut Ab: <2 U/mL (ref 0–5)
Transglutaminase IgA: <2 U/mL (ref 0–3)

## 2023-02-14 LAB — ALPHA-GAL PANEL
Allergen Lamb IgE: <0.1 kU/L
Beef IgE: <0.1 kU/L
IgE (Immunoglobulin E), Serum: 8 [IU]/mL (ref 6–495)
O215-IgE Alpha-Gal: <0.1 kU/L
Pork IgE: <0.1 kU/L

## 2023-02-14 LAB — VITAMIN B12: Vitamin B-12: 468 pg/mL (ref 232–1245)

## 2023-02-14 LAB — METHYLMALONIC ACID, SERUM: Methylmalonic Acid: 295 nmol/L (ref 0–378)

## 2023-02-14 LAB — C-REACTIVE PROTEIN: CRP: <1 mg/L (ref 0–10)

## 2023-02-16 ENCOUNTER — Other Ambulatory Visit: Payer: Self-pay | Admitting: Family Medicine

## 2023-02-16 MED ORDER — ROSUVASTATIN CALCIUM 10 MG PO TABS
10.0000 mg | ORAL_TABLET | Freq: Every day | ORAL | 1 refills | Status: DC
Start: 2023-02-16 — End: 2023-12-28

## 2023-02-17 ENCOUNTER — Other Ambulatory Visit: Payer: Self-pay

## 2023-02-17 DIAGNOSIS — D229 Melanocytic nevi, unspecified: Secondary | ICD-10-CM

## 2023-02-19 ENCOUNTER — Other Ambulatory Visit: Payer: Self-pay

## 2023-02-19 ENCOUNTER — Ambulatory Visit (HOSPITAL_COMMUNITY): Payer: Managed Care, Other (non HMO) | Attending: Family Medicine

## 2023-02-19 DIAGNOSIS — M25561 Pain in right knee: Secondary | ICD-10-CM | POA: Diagnosis present

## 2023-02-19 DIAGNOSIS — M5459 Other low back pain: Secondary | ICD-10-CM | POA: Insufficient documentation

## 2023-02-19 DIAGNOSIS — M545 Low back pain, unspecified: Secondary | ICD-10-CM | POA: Insufficient documentation

## 2023-02-19 DIAGNOSIS — G8929 Other chronic pain: Secondary | ICD-10-CM | POA: Insufficient documentation

## 2023-02-19 DIAGNOSIS — G609 Hereditary and idiopathic neuropathy, unspecified: Secondary | ICD-10-CM | POA: Diagnosis not present

## 2023-02-19 NOTE — Therapy (Signed)
OUTPATIENT PHYSICAL THERAPY THORACOLUMBAR/LOWER EXTREMITY EVALUATION   Patient Name: Olivia Johnson MRN: [id number] DOB:1965/02/27, 58 y.o., female Today's Date: 02/19/2023  END OF SESSION:  PT End of Session - 02/19/23 1017     Visit Number 1    Number of Visits 5    Date for PT Re-Evaluation 03/19/23    Authorization Type [employer] Managed (60 visit limits)    PT Start Time 0845    PT Stop Time 0935    PT Time Calculation (min) 50 min    Activity Tolerance Patient tolerated treatment well    Behavior During Therapy [facility] for tasks assessed/performed             Past Medical History:  Diagnosis Date   Raynaud's syndrome 03/17/2019   Has had this since teenager years mainly in the hands some in the toes   Past Surgical History:  Procedure Laterality Date   COLONOSCOPY N/A 09/17/2016   Procedure: COLONOSCOPY;  Surgeon: Corbin Ade, MD;  [facility];  Service: Endoscopy;  Laterality: N/A;  2:30 pm   Patient Active Problem List   Diagnosis Date Noted   Raynaud's syndrome 03/17/2019   Vitamin D insufficiency 07/13/2016    PCP: Babs Sciara, MD  REFERRING PROVIDER: Babs Sciara, MD  REFERRING DIAG: G60.9 (ICD-10-CM) - Idiopathic peripheral neuropathy M54.50 (ICD-10-CM) - Lumbar pain  Rationale for Evaluation and Treatment: Rehabilitation  THERAPY DIAG:  Other low back pain  Chronic pain of right knee  ONSET DATE: Summer 2024  SUBJECTIVE:                                                                                                                                                                                           SUBJECTIVE STATEMENT: EVAL: Arrives to the clinic with low back pain and R knee pain (see below). Patient states that she is more concerned about her knee at present. Denies any weakness on the legs but reports of some numbness on the feet. Condition on the back and the knee started summer of 2024 without apparent reason but  attributes pain to a lot of physical activity (e.g. hiking). Patient rested the area but the pain is not going away. Patient then went to her MD recently. No imaging was done and no meds were given. Patient was just referred to outpatient PT evaluation and management  PERTINENT HISTORY:  Raynaud's phenomenon, R plantar fasciitis  PAIN:  Are you having pain? Yes: NPRS scale: none at the time of evaluation but can be 6/10 on aggravating factors Pain location: low back, medial side of the R knee Pain description:  soreness Aggravating factors: bending over, turning your foot out, squatting Relieving factors: sitting, arching back  PRECAUTIONS: Other: Raynaud's phenomenon  RED FLAGS: None   WEIGHT BEARING RESTRICTIONS: No  FALLS:  Has patient fallen in last 6 months? No  LIVING ENVIRONMENT: Lives with: lives with their spouse Lives in: House/apartment Stairs: Yes: External: 4 steps; on right going up and on left going up Has following equipment at home: None  OCCUPATION: administrative (1x/day)  PLOF: Independent and Independent with basic ADLs  PATIENT GOALS: "to be able to hike and caring with grand daughter"  NEXT MD VISIT: April 09, 2023  OBJECTIVE:  Note: Objective measures were completed at Evaluation unless otherwise noted.  DIAGNOSTIC FINDINGS:  None performed relevant to the area/complaint recently   PATIENT SURVEYS:  LEFS 46/80 = 57.5%  COGNITION: Overall cognitive status: Within functional limits for tasks assessed     SENSATION: Light touch: Impaired on L LE  MUSCLE LENGTH: Hamstrings: moderate restriction on B Thomas test: WFL Piriformis: mild restriction on R, WFL on L ITB: mild restriction on B  POSTURE:  standing: R foot medial arch slightly elevated, R iliac crest higher, R foot in slight ER, slight R genu recurvatum, R LE is shorter in supine, L LE did not get short in supine to sit  PALPATION: Grade 1 tenderness on L3-L4 facet joints and  spinous process Grade 1 tenderness on the R medial joint line Normal patellar mobility on all motions on B  LUMBAR ROM:   AROM eval  Flexion WFL  Extension WFL  Right lateral flexion   Left lateral flexion   Right rotation WFL  Left rotation WFL   (Blank rows = not tested)  LOWER EXTREMITY ROM:     Active  Right eval Left eval  Hip flexion [facility]  Hip extension [facility]  Hip abduction [facility]  Hip adduction    Hip internal rotation    Hip external rotation    Knee flexion [facility] WFL  Knee extension [facility] WFL  Ankle dorsiflexion [facility] WFL  Ankle plantarflexion [facility] WFL  Ankle inversion    Ankle eversion     (Blank rows = not tested)  LOWER EXTREMITY MMT:    MMT Right eval Left eval  Hip flexion 4+ 4+  Hip extension 4+ 4+  Hip abduction 4+ 4+  Hip adduction    Hip internal rotation    Hip external rotation    Knee flexion 5 5  Knee extension 5 5  Ankle dorsiflexion 5 5  Ankle plantarflexion 5 5  Ankle inversion    Ankle eversion     (Blank rows = not tested)  LUMBAR SPECIAL TESTS:  Straight leg raise test: Negative for radicular symptoms  KNEE SPECIAL TESTS:    (-) Apley's compression test on B   Valgus/varus stress test aggravates R knee pain  FUNCTIONAL TESTS:  5 times sit to stand: 9.86 sec 2 minute walk test: 708 ft  GAIT: Distance walked: 708 ft Assistive device utilized: None Level of assistance: Complete Independence Comments: done with  TREATMENT DATE:  02/19/23 Evaluation and patient education done  PATIENT EDUCATION:  Education details: Educated on the pathoanatomy of low back pain. Educated on the goals and course of rehab.  Person educated: Patient Education method: Explanation Education comprehension: verbalized understanding  HOME EXERCISE PROGRAM: None provided to date  ASSESSMENT:  CLINICAL  IMPRESSION: EVAL: Patient is a 58 y.o. female who was seen today for physical therapy evaluation and treatment for idiopathic neuropathy and lumbar pain. Based on the objective findings, patient presents with slight R pelvic upslip. Patient's condition is further defined by difficulty with squatting and bending over due to pain, weakness, and decreased soft tissue extensibility. Skilled PT is required to address the impairments and functional limitations listed below.    OBJECTIVE IMPAIRMENTS: decreased strength, impaired flexibility, postural dysfunction, and pain.   ACTIVITY LIMITATIONS: bending and squatting  PARTICIPATION LIMITATIONS:  recreation and taking care of granddaughter  PERSONAL FACTORS: Time since onset of injury/illness/exacerbation are also affecting patient's functional outcome.   REHAB POTENTIAL: Good  CLINICAL DECISION MAKING: Stable/uncomplicated  EVALUATION COMPLEXITY: Low   GOALS: Goals reviewed with patient? No  SHORT TERM GOALS: Target date: 03/05/23  Pt will demonstrate indep in HEP to facilitate carry-over of skilled services and improve functional outcomes Goal status: INITIAL  LONG TERM GOALS: Target date: 03/19/23  Pt will increase LEFS by at least 9 points in order to demonstrate significant improvement in lower extremity function. Baseline: 46/80 Goal status: INITIAL  2.  Pt will demonstrate increase in LE strength to 5/5 to facilitate ease and safety in ambulation Baseline: 4+/5 Goal status: INITIAL  3.  Pt will be able to squat and bend over with little pain (1-2/10) to facilitate ease in recreation and taking care of granddaughter. Baseline:6/10 Goal status: INITIAL   PLAN:  PT FREQUENCY: 1x/week  PT DURATION: 4 weeks  PLANNED INTERVENTIONS: 97164- PT Re-evaluation, 97110-Therapeutic exercises, 97530- Therapeutic activity, [id number]- Neuromuscular re-education, 97535- Self Care, [id number]- Manual therapy, [phone]- Gait training, and  Patient/Family education.  PLAN FOR NEXT SESSION: Provide HEP. Begin LE flexibility and core/hip strengthening. May perform manual therapy for pelvic upslip.   Tish Frederickson. Tadan Shill, PT, DPT, OCS Board-Certified Clinical Specialist in Orthopedic PT PT Compact Privilege # ([place]): [id number] T 02/19/2023, 10:19 AM

## 2023-02-24 ENCOUNTER — Ambulatory Visit (HOSPITAL_COMMUNITY): Payer: Managed Care, Other (non HMO) | Admitting: Physical Therapy

## 2023-02-24 DIAGNOSIS — M5459 Other low back pain: Secondary | ICD-10-CM | POA: Diagnosis not present

## 2023-02-24 DIAGNOSIS — G8929 Other chronic pain: Secondary | ICD-10-CM

## 2023-02-24 NOTE — Therapy (Signed)
OUTPATIENT PHYSICAL THERAPY TREATMENT   Patient Name: Olivia Johnson MRN: [id number] DOB:1965-10-26, 58 y.o., female Today's Date: 02/24/2023  END OF SESSION:  PT End of Session - 02/24/23 0093     Visit Number 2    Number of Visits 5    Date for PT Re-Evaluation 03/19/23    Authorization Type [employer] Managed (60 visit limits)    PT Start Time 0805    PT Stop Time 0850    PT Time Calculation (min) 45 min    Activity Tolerance Patient tolerated treatment well    Behavior During Therapy [facility] for tasks assessed/performed             Past Medical History:  Diagnosis Date   Raynaud's syndrome 03/17/2019   Has had this since teenager years mainly in the hands some in the toes   Past Surgical History:  Procedure Laterality Date   COLONOSCOPY N/A 09/17/2016   Procedure: COLONOSCOPY;  Surgeon: Corbin Ade, MD;  [facility];  Service: Endoscopy;  Laterality: N/A;  2:30 pm   Patient Active Problem List   Diagnosis Date Noted   Raynaud's syndrome 03/17/2019   Vitamin D insufficiency 07/13/2016    PCP: Babs Sciara, MD  REFERRING PROVIDER: Babs Sciara, MD  REFERRING DIAG: G60.9 (ICD-10-CM) - Idiopathic peripheral neuropathy M54.50 (ICD-10-CM) - Lumbar pain  Rationale for Evaluation and Treatment: Rehabilitation  THERAPY DIAG:  Other low back pain  Chronic pain of right knee  ONSET DATE: Summer 2024  SUBJECTIVE:                                                                                                                                                                                           SUBJECTIVE STATEMENT: Pt states she has several hiking events coming up and wants to be ready for them.  Currently doing okay.  Noted malalignment of pelvis.  EVAL: Arrives to the clinic with low back pain and R knee pain (see below). Patient states that she is more concerned about her knee at present. Denies any weakness on the legs but reports of some  numbness on the feet. Condition on the back and the knee started summer of 2024 without apparent reason but attributes pain to a lot of physical activity (e.g. hiking). Patient rested the area but the pain is not going away. Patient then went to her MD recently. No imaging was done and no meds were given. Patient was just referred to outpatient PT evaluation and management  PERTINENT HISTORY:  Raynaud's phenomenon, R plantar fasciitis  PAIN:  Are you having pain? Yes: NPRS scale: none  at the time of evaluation but can be 6/10 on aggravating factors Pain location: low back, medial side of the R knee Pain description: soreness Aggravating factors: bending over, turning your foot out, squatting Relieving factors: sitting, arching back  PRECAUTIONS: Other: Raynaud's phenomenon  RED FLAGS: None   WEIGHT BEARING RESTRICTIONS: No  FALLS:  Has patient fallen in last 6 months? No  LIVING ENVIRONMENT: Lives with: lives with their spouse Lives in: House/apartment Stairs: Yes: External: 4 steps; on right going up and on left going up Has following equipment at home: None  OCCUPATION: administrative (1x/day)  PLOF: Independent and Independent with basic ADLs  PATIENT GOALS: "to be able to hike and caring with grand daughter"  NEXT MD VISIT: April 09, 2023  OBJECTIVE:  Note: Objective measures were completed at Evaluation unless otherwise noted.  DIAGNOSTIC FINDINGS:  None performed relevant to the area/complaint recently   PATIENT SURVEYS:  LEFS 46/80 = 57.5%  COGNITION: Overall cognitive status: Within functional limits for tasks assessed     SENSATION: Light touch: Impaired on L LE  MUSCLE LENGTH: Hamstrings: moderate restriction on B Thomas test: WFL Piriformis: mild restriction on R, WFL on L ITB: mild restriction on B  POSTURE:  standing: R foot medial arch slightly elevated, R iliac crest higher, R foot in slight ER, slight R genu recurvatum, R LE is shorter in  supine, L LE did not get short in supine to sit  PALPATION: Grade 1 tenderness on L3-L4 facet joints and spinous process Grade 1 tenderness on the R medial joint line Normal patellar mobility on all motions on B  LUMBAR ROM:   AROM eval  Flexion WFL  Extension WFL  Right lateral flexion   Left lateral flexion   Right rotation WFL  Left rotation WFL   (Blank rows = not tested)  LOWER EXTREMITY ROM:     Active  Right eval Left eval  Hip flexion [facility]  Hip extension [facility]  Hip abduction [facility]  Hip adduction    Hip internal rotation    Hip external rotation    Knee flexion [facility] WFL  Knee extension [facility] WFL  Ankle dorsiflexion [facility] WFL  Ankle plantarflexion [facility] WFL  Ankle inversion    Ankle eversion     (Blank rows = not tested)  LOWER EXTREMITY MMT:    MMT Right eval Left eval  Hip flexion 4+ 4+  Hip extension 4+ 4+  Hip abduction 4+ 4+  Hip adduction    Hip internal rotation    Hip external rotation    Knee flexion 5 5  Knee extension 5 5  Ankle dorsiflexion 5 5  Ankle plantarflexion 5 5  Ankle inversion    Ankle eversion     (Blank rows = not tested)  LUMBAR SPECIAL TESTS:  Straight leg raise test: Negative for radicular symptoms  KNEE SPECIAL TESTS:    (-) Apley's compression test on B   Valgus/varus stress test aggravates R knee pain  FUNCTIONAL TESTS:  5 times sit to stand: 9.86 sec 2 minute walk test: 708 ft  GAIT: Distance walked: 708 ft Assistive device utilized: None Level of assistance: Complete Independence Comments: done with  TREATMENT DATE:  02/24/23 Pelvic alignment; Muscle energy technique for Rt anterior rotation Standing:  plantar fascia stretch/hamstring stretch (demo/pt is doing these at home)  Lumbar extension (pt is doing this at home) Seated:  piriformis stretch 3X30" each (Lt much tighter than Rt) Supine: Bridge bil keeping  hips even 10X  Rt LE bridge only 10X  Lt LE SLR 10X  Abdominal/pelvic isometric  10X5"  Hip adduction isometric with pillow 10X5" Side lying hip abduction 10X each Prone hip extension 10X  02/19/23 Evaluation and patient education done                                                                                                                               PATIENT EDUCATION:  Education details: Educated on the pathoanatomy of low back pain. Educated on the goals and course of rehab.  Person educated: Patient Education method: Explanation Education comprehension: verbalized understanding  HOME EXERCISE PROGRAM: None provided at evaluation  Access Code: [email or web address] URL: [email or web address] Date: 02/24/2023 Prepared by: Emeline Gins  Exercises - Supine Bridge  - 3 x daily - 7 x weekly - 10 reps - 3 sec hold - Single Leg Bridge  - 3 x daily - 7 x weekly - 10 reps - Supine Transversus Abdominis Bracing with Pelvic Floor Contraction  - 3 x daily - 7 x weekly - 10 reps - 5 sec hold - Straight Leg Raise  - 3 x daily - 7 x weekly - 10 reps - Sidelying Hip Abduction  - 3 x daily - 7 x weekly - 10 reps - Supine Hip Adduction Isometric with Ball  - 3 x daily - 7 x weekly - 10 reps - 5 sec hold - Prone Hip Extension  - 3 x daily - 7 x weekly - 10 reps - Seated Piriformis Stretch with Trunk Bend  - 3 x daily - 7 x weekly - 3 reps - 30 sec hold - Seated Table Hamstring Stretch  - 3 x daily - 7 x weekly - 10 reps - 30 sec hold - Plantar Fascia Stretch on Step  - 3 x daily - 7 x weekly - 3 reps - 30 sec hold ASSESSMENT:  CLINICAL IMPRESSION: Reviewed goals and POC moving forward.  Checked SI for alignment with noted anterior rotation of Rt pelvis.  Muscle energy technqiue (MET) was completed with food results and successful alignment.  Pt instructed with self MET and new exercises to help improve LE strength and flexibility.  Lt piriformis with noted tightness and general tightness in bil hamstrings. Pt given written HEP to complete with no questions at end of  session.  Overall good form and minimal cues needed for form and hold times.   EVAL: Patient is a 58 y.o. female who was seen today for physical therapy evaluation and treatment for idiopathic neuropathy and lumbar pain. Based on the objective findings, patient presents with slight R pelvic upslip. Patient's condition is further defined by difficulty with squatting and bending over due to pain, weakness, and decreased soft tissue extensibility. Skilled PT is required to address the impairments and functional limitations listed below.    OBJECTIVE IMPAIRMENTS: decreased strength, impaired flexibility, postural dysfunction, and pain.   ACTIVITY LIMITATIONS: bending and  squatting  PARTICIPATION LIMITATIONS:  recreation and taking care of granddaughter  PERSONAL FACTORS: Time since onset of injury/illness/exacerbation are also affecting patient's functional outcome.   REHAB POTENTIAL: Good  CLINICAL DECISION MAKING: Stable/uncomplicated  EVALUATION COMPLEXITY: Low   GOALS: Goals reviewed with patient? No  SHORT TERM GOALS: Target date: 03/05/23  Pt will demonstrate indep in HEP to facilitate carry-over of skilled services and improve functional outcomes Goal status: INITIAL  LONG TERM GOALS: Target date: 03/19/23  Pt will increase LEFS by at least 9 points in order to demonstrate significant improvement in lower extremity function. Baseline: 46/80 Goal status: INITIAL  2.  Pt will demonstrate increase in LE strength to 5/5 to facilitate ease and safety in ambulation Baseline: 4+/5 Goal status: INITIAL  3.  Pt will be able to squat and bend over with little pain (1-2/10) to facilitate ease in recreation and taking care of granddaughter. Baseline:6/10 Goal status: INITIAL   PLAN:  PT FREQUENCY: 1x/week  PT DURATION: 4 weeks  PLANNED INTERVENTIONS: 97164- PT Re-evaluation, 97110-Therapeutic exercises, 97530- Therapeutic activity, [id number]- Neuromuscular re-education, 97535- Self  Care, [id number]- Manual therapy, [phone]- Gait training, and Patient/Family education.  PLAN FOR NEXT SESSION: Progress with core/hip strengthening. May perform manual therapy for pelvic upslip and/or rotation.  Progress abdominal/core stab using physioball as pt has one at home.   Lurena Nida, PTA/CLT [facility] Ph: [phone] 02/24/2023, 10:07 AM

## 2023-03-03 ENCOUNTER — Ambulatory Visit (HOSPITAL_COMMUNITY): Payer: Managed Care, Other (non HMO)

## 2023-03-03 DIAGNOSIS — M5459 Other low back pain: Secondary | ICD-10-CM | POA: Diagnosis not present

## 2023-03-03 NOTE — Therapy (Signed)
OUTPATIENT PHYSICAL THERAPY TREATMENT   Patient Name: Olivia Johnson MRN: [id number] DOB:[birth year], 58 y.o., female Today's Date: 03/03/2023  END OF SESSION:  PT End of Session - 03/03/23 0807     Visit Number 3    Number of Visits 5    Date for PT Re-Evaluation 03/19/23    Authorization Type [employer] Managed (60 visit limits)    PT Start Time 0800    PT Stop Time 0845    PT Time Calculation (min) 45 min    Activity Tolerance Patient tolerated treatment well    Behavior During Therapy [facility] for tasks assessed/performed              Past Medical History:  Diagnosis Date   Raynaud's syndrome 03/17/2019   Has had this since teenager years mainly in the hands some in the toes   Past Surgical History:  Procedure Laterality Date   COLONOSCOPY N/A 09/17/2016   Procedure: COLONOSCOPY;  Surgeon: Corbin Ade, MD;  [facility];  Service: Endoscopy;  Laterality: N/A;  2:30 pm   Patient Active Problem List   Diagnosis Date Noted   Raynaud's syndrome 03/17/2019   Vitamin D insufficiency 07/13/2016    PCP: Babs Sciara, MD  REFERRING PROVIDER: Babs Sciara, MD  REFERRING DIAG: G60.9 (ICD-10-CM) - Idiopathic peripheral neuropathy M54.50 (ICD-10-CM) - Lumbar pain  Rationale for Evaluation and Treatment: Rehabilitation  THERAPY DIAG:  Other low back pain  ONSET DATE: Summer 2024  SUBJECTIVE:                                                                                                                                                                                           SUBJECTIVE STATEMENT: Patient states that her plantar fasciitis and knee are doing a lot better. However, she has not seen any changes on her low back pain. Patient states that she was alright after the last session.  EVAL: Arrives to the clinic with low back pain and R knee pain (see below). Patient states that she is more concerned about her knee at present. Denies any weakness on the  legs but reports of some numbness on the feet. Condition on the back and the knee started summer of 2024 without apparent reason but attributes pain to a lot of physical activity (e.g. hiking). Patient rested the area but the pain is not going away. Patient then went to her MD recently. No imaging was done and no meds were given. Patient was just referred to outpatient PT evaluation and management  PERTINENT HISTORY:  Raynaud's phenomenon, R plantar fasciitis  PAIN:  Are you having  pain? Yes: NPRS scale: none at the time of evaluation but can be 6/10 on aggravating factors Pain location: low back, medial side of the R knee Pain description: soreness Aggravating factors: bending over, turning your foot out, squatting Relieving factors: sitting, arching back  PRECAUTIONS: Other: Raynaud's phenomenon  RED FLAGS: None   WEIGHT BEARING RESTRICTIONS: No  FALLS:  Has patient fallen in last 6 months? No  LIVING ENVIRONMENT: Lives with: lives with their spouse Lives in: House/apartment Stairs: Yes: External: 4 steps; on right going up and on left going up Has following equipment at home: None  OCCUPATION: administrative (1x/day)  PLOF: Independent and Independent with basic ADLs  PATIENT GOALS: "to be able to hike and caring with grand daughter"  NEXT MD VISIT: April 09, 2023  OBJECTIVE:  Note: Objective measures were completed at Evaluation unless otherwise noted.  DIAGNOSTIC FINDINGS:  None performed relevant to the area/complaint recently   PATIENT SURVEYS:  LEFS 46/80 = 57.5%  COGNITION: Overall cognitive status: Within functional limits for tasks assessed     SENSATION: Light touch: Impaired on L LE  MUSCLE LENGTH: Hamstrings: moderate restriction on B Thomas test: WFL Piriformis: mild restriction on R, WFL on L ITB: mild restriction on B  POSTURE:  standing: R foot medial arch slightly elevated, R iliac crest higher, R foot in slight ER, slight R genu recurvatum,  R LE is shorter in supine, L LE did not get short in supine to sit  PALPATION: Grade 1 tenderness on L3-L4 facet joints and spinous process Grade 1 tenderness on the R medial joint line Normal patellar mobility on all motions on B  LUMBAR ROM:   AROM eval  Flexion WFL  Extension WFL  Right lateral flexion   Left lateral flexion   Right rotation WFL  Left rotation WFL   (Blank rows = not tested)  LOWER EXTREMITY ROM:     Active  Right eval Left eval  Hip flexion [facility]  Hip extension [facility]  Hip abduction [facility]  Hip adduction    Hip internal rotation    Hip external rotation    Knee flexion [facility] WFL  Knee extension [facility] WFL  Ankle dorsiflexion [facility] WFL  Ankle plantarflexion [facility] WFL  Ankle inversion    Ankle eversion     (Blank rows = not tested)  LOWER EXTREMITY MMT:    MMT Right eval Left eval  Hip flexion 4+ 4+  Hip extension 4+ 4+  Hip abduction 4+ 4+  Hip adduction    Hip internal rotation    Hip external rotation    Knee flexion 5 5  Knee extension 5 5  Ankle dorsiflexion 5 5  Ankle plantarflexion 5 5  Ankle inversion    Ankle eversion     (Blank rows = not tested)  LUMBAR SPECIAL TESTS:  Straight leg raise test: Negative for radicular symptoms  KNEE SPECIAL TESTS:    (-) Apley's compression test on B   Valgus/varus stress test aggravates R knee pain  FUNCTIONAL TESTS:  5 times sit to stand: 9.86 sec 2 minute walk test: 708 ft  GAIT: Distance walked: 708 ft Assistive device utilized: None Level of assistance: Complete Independence Comments: done with  TREATMENT DATE:  03/03/23 Elliptical, level 1 x 5' Seated hamstring stretch x 30" x 3 Seated piriformis stretch x 30" x 3 Patient education on pain neuroscience - patient gave excellent understanding Grade 2 long axis L hip distraction x 30" x 3  with grade 1 inf thrust on last rep Bridges with hip abd, GTB x 3" x 10 x 2 SLR x 10 x 2 with 2 lbs Mini squats with GTB on thighs x 3  "x 10 x 2   02/24/23 Pelvic alignment; Muscle energy technique for Rt anterior rotation Standing:  plantar fascia stretch/hamstring stretch (demo/pt is doing these at home)  Lumbar extension (pt is doing this at home) Seated:  piriformis stretch 3X30" each (Lt much tighter than Rt) Supine: Bridge bil keeping hips even 10X  Rt LE bridge only 10X  Lt LE SLR 10X  Abdominal/pelvic isometric 10X5"  Hip adduction isometric with pillow 10X5" Side lying hip abduction 10X each Prone hip extension 10X  02/19/23 Evaluation and patient education done                                                                                                                               PATIENT EDUCATION:  Education details: Educated on the pathoanatomy of low back pain. Educated on the goals and course of rehab.  Person educated: Patient Education method: Explanation Education comprehension: verbalized understanding  HOME EXERCISE PROGRAM: None provided at evaluation  Access Code: [email or web address] URL: [email or web address] 03/03/23 - Mini Squat  - 2 x daily - 7 x weekly - 2 sets - 10 reps - 3 hold  Date: 02/24/2023 Prepared by: Emeline Gins  Exercises - Supine Bridge  - 3 x daily - 7 x weekly - 10 reps - 3 sec hold - Single Leg Bridge  - 3 x daily - 7 x weekly - 10 reps - Supine Transversus Abdominis Bracing with Pelvic Floor Contraction  - 3 x daily - 7 x weekly - 10 reps - 5 sec hold - Straight Leg Raise  - 3 x daily - 7 x weekly - 10 reps - Sidelying Hip Abduction  - 3 x daily - 7 x weekly - 10 reps - Supine Hip Adduction Isometric with Ball  - 3 x daily - 7 x weekly - 10 reps - 5 sec hold - Prone Hip Extension  - 3 x daily - 7 x weekly - 10 reps - Seated Piriformis Stretch with Trunk Bend  - 3 x daily - 7 x weekly - 3 reps - 30 sec hold - Seated Table Hamstring Stretch  - 3 x daily - 7 x weekly - 10 reps - 30 sec hold - Plantar Fascia Stretch on Step  - 3 x daily - 7 x weekly - 3 reps -  30 sec hold ASSESSMENT:  CLINICAL IMPRESSION: Interventions today were geared towards hip strengthening and mobility. Tolerated all activities without worsening of symptoms. Manual therapy was done as B medial malleoli are still not level. B ASIS and medial malleoli after the manual therapy technique. Demonstrated appropriate levels of fatigue. Provided little to no amount of cueing to ensure correct execution of activity with good carry-over. To date, skilled PT is required  to address the impairments and improve function.   EVAL: Patient is a 58 y.o. female who was seen today for physical therapy evaluation and treatment for idiopathic neuropathy and lumbar pain. Based on the objective findings, patient presents with slight R pelvic upslip. Patient's condition is further defined by difficulty with squatting and bending over due to pain, weakness, and decreased soft tissue extensibility. Skilled PT is required to address the impairments and functional limitations listed below.    OBJECTIVE IMPAIRMENTS: decreased strength, impaired flexibility, postural dysfunction, and pain.   ACTIVITY LIMITATIONS: bending and squatting  PARTICIPATION LIMITATIONS:  recreation and taking care of granddaughter  PERSONAL FACTORS: Time since onset of injury/illness/exacerbation are also affecting patient's functional outcome.   REHAB POTENTIAL: Good  CLINICAL DECISION MAKING: Stable/uncomplicated  EVALUATION COMPLEXITY: Low   GOALS: Goals reviewed with patient? No  SHORT TERM GOALS: Target date: 03/05/23  Pt will demonstrate indep in HEP to facilitate carry-over of skilled services and improve functional outcomes Goal status: INITIAL  LONG TERM GOALS: Target date: 03/19/23  Pt will increase LEFS by at least 9 points in order to demonstrate significant improvement in lower extremity function. Baseline: 46/80 Goal status: INITIAL  2.  Pt will demonstrate increase in LE strength to 5/5 to facilitate  ease and safety in ambulation Baseline: 4+/5 Goal status: INITIAL  3.  Pt will be able to squat and bend over with little pain (1-2/10) to facilitate ease in recreation and taking care of granddaughter. Baseline:6/10 Goal status: INITIAL   PLAN:  PT FREQUENCY: 1x/week  PT DURATION: 4 weeks  PLANNED INTERVENTIONS: 97164- PT Re-evaluation, 97110-Therapeutic exercises, 97530- Therapeutic activity, [id number]- Neuromuscular re-education, 97535- Self Care, 16109- Manual therapy, [phone]- Gait training, and Patient/Family education.  PLAN FOR NEXT SESSION: Progress with core/hip strengthening. Re-check pelvic alignment.  Progress abdominal/core stab using physioball as pt has one at home.    Tish Frederickson. Elder Negus, PT, DPT, OCS [facility] Ph: [phone] 03/03/2023, 9:01 AM

## 2023-03-10 ENCOUNTER — Ambulatory Visit (HOSPITAL_COMMUNITY): Payer: Managed Care, Other (non HMO) | Attending: Family Medicine

## 2023-03-10 DIAGNOSIS — M25561 Pain in right knee: Secondary | ICD-10-CM | POA: Diagnosis present

## 2023-03-10 DIAGNOSIS — M5459 Other low back pain: Secondary | ICD-10-CM | POA: Insufficient documentation

## 2023-03-10 DIAGNOSIS — G8929 Other chronic pain: Secondary | ICD-10-CM | POA: Insufficient documentation

## 2023-03-10 NOTE — Therapy (Addendum)
 OUTPATIENT PHYSICAL THERAPY TREATMENT   Patient Name: Olivia Johnson MRN: [id number] DOB:[birth year], 58 y.o., female Today's Date: 03/10/2023  END OF SESSION:  PT End of Session - 03/10/23 0811     Visit Number 4    Number of Visits 5    Date for PT Re-Evaluation 03/19/23    Authorization Type [employer] Managed (60 visit limits)    PT Start Time 0810    PT Stop Time 0845    PT Time Calculation (min) 35 min    Activity Tolerance Patient tolerated treatment well    Behavior During Therapy [facility] for tasks assessed/performed            Past Medical History:  Diagnosis Date   Raynaud's syndrome 03/17/2019   Has had this since teenager years mainly in the hands some in the toes   Past Surgical History:  Procedure Laterality Date   COLONOSCOPY N/A 09/17/2016   Procedure: COLONOSCOPY;  Surgeon: Shaaron Lamar HERO, MD;  [facility];  Service: Endoscopy;  Laterality: N/A;  2:30 pm   Patient Active Problem List   Diagnosis Date Noted   Raynaud's syndrome 03/17/2019   Vitamin D  insufficiency 07/13/2016    PCP: Alphonsa Glendia LABOR, MD  REFERRING PROVIDER: Alphonsa Glendia LABOR, MD  REFERRING DIAG: G60.9 (ICD-10-CM) - Idiopathic peripheral neuropathy M54.50 (ICD-10-CM) - Lumbar pain  Rationale for Evaluation and Treatment: Rehabilitation  THERAPY DIAG:  Other low back pain  Chronic pain of right knee  ONSET DATE: Summer 2024  SUBJECTIVE:                                                                                                                                                                                           SUBJECTIVE STATEMENT: Doing well today. Denies pain. Patient reports that her low back will hurt when she bends forward (4/10).  EVAL: Arrives to the clinic with low back pain and R knee pain (see below). Patient states that she is more concerned about her knee at present. Denies any weakness on the legs but reports of some numbness on the feet. Condition on the  back and the knee started summer of 2024 without apparent reason but attributes pain to a lot of physical activity (e.g. hiking). Patient rested the area but the pain is not going away. Patient then went to her MD recently. No imaging was done and no meds were given. Patient was just referred to outpatient PT evaluation and management  PERTINENT HISTORY:  Raynaud's phenomenon, R plantar fasciitis  PAIN:  Are you having pain? Yes: NPRS scale: none at the time of evaluation but can be  6/10 on aggravating factors Pain location: low back, medial side of the R knee Pain description: soreness Aggravating factors: bending over, turning your foot out, squatting Relieving factors: sitting, arching back  PRECAUTIONS: Other: Raynaud's phenomenon  RED FLAGS: None   WEIGHT BEARING RESTRICTIONS: No  FALLS:  Has patient fallen in last 6 months? No  LIVING ENVIRONMENT: Lives with: lives with their spouse Lives in: House/apartment Stairs: Yes: External: 4 steps; on right going up and on left going up Has following equipment at home: None  OCCUPATION: administrative (1x/day)  PLOF: Independent and Independent with basic ADLs  PATIENT GOALS: to be able to hike and caring with grand daughter  NEXT MD VISIT: April 09, 2023  OBJECTIVE:  Note: Objective measures were completed at Evaluation unless otherwise noted.  DIAGNOSTIC FINDINGS:  None performed relevant to the area/complaint recently   PATIENT SURVEYS:  LEFS 46/80 = 57.5%  COGNITION: Overall cognitive status: Within functional limits for tasks assessed     SENSATION: Light touch: Impaired on L LE  MUSCLE LENGTH: Hamstrings: moderate restriction on B Thomas test: WFL Piriformis: mild restriction on R, WFL on L ITB: mild restriction on B  POSTURE:  standing: R foot medial arch slightly elevated, R iliac crest higher, R foot in slight ER, slight R genu recurvatum, R LE is shorter in supine, L LE did not get short in supine to  sit  PALPATION: Grade 1 tenderness on L3-L4 facet joints and spinous process Grade 1 tenderness on the R medial joint line Normal patellar mobility on all motions on B  LUMBAR ROM:   AROM eval  Flexion WFL  Extension WFL  Right lateral flexion   Left lateral flexion   Right rotation WFL  Left rotation WFL   (Blank rows = not tested)  LOWER EXTREMITY ROM:     Active  Right eval Left eval  Hip flexion [facility]  Hip extension [facility]  Hip abduction [facility]  Hip adduction    Hip internal rotation    Hip external rotation    Knee flexion [facility] WFL  Knee extension [facility] WFL  Ankle dorsiflexion [facility] WFL  Ankle plantarflexion [facility] WFL  Ankle inversion    Ankle eversion     (Blank rows = not tested)  LOWER EXTREMITY MMT:    MMT Right eval Left eval  Hip flexion 4+ 4+  Hip extension 4+ 4+  Hip abduction 4+ 4+  Hip adduction    Hip internal rotation    Hip external rotation    Knee flexion 5 5  Knee extension 5 5  Ankle dorsiflexion 5 5  Ankle plantarflexion 5 5  Ankle inversion    Ankle eversion     (Blank rows = not tested)  LUMBAR SPECIAL TESTS:  Straight leg raise test: Negative for radicular symptoms  KNEE SPECIAL TESTS:    (-) Apley's compression test on B   Valgus/varus stress test aggravates R knee pain  FUNCTIONAL TESTS:  5 times sit to stand: 9.86 sec 2 minute walk test: 708 ft  GAIT: Distance walked: 708 ft Assistive device utilized: None Level of assistance: Complete Independence Comments: done with  TREATMENT DATE:  03/10/23 Elliptical, level 1 x 5' Seated hamstring stretch x 30 x 3 Seated piriformis stretch x 30 x 3 Grade 2 long axis R hip distraction x 30 x 3 with grade 1 inf thrust on last rep Seated trunk forward and lat flex x 30 x 2 Cat and cow x 20  Bridges with hip abd, GTB x 3 x 10 x 2 SLR x 10 x 2 with 2 lbs Mini squats with GTB on thighs x 3 x 10 x 2  03/03/23 Elliptical, level 1 x 5' Seated hamstring stretch x 30 x  3 Seated piriformis stretch x 30 x 3 Patient education on pain neuroscience - patient gave excellent understanding Grade 2 long axis L hip distraction x 30 x 3 with grade 1 inf thrust on last rep Bridges with hip abd, GTB x 3 x 10 x 2 SLR x 10 x 2 with 2 lbs Mini squats with GTB on thighs x 3 x 10 x 2  03/03/23 Elliptical, level 1 x 5' Seated hamstring stretch x 30 x 3 Seated piriformis stretch x 30 x 3 Patient education on pain neuroscience - patient gave excellent understanding Grade 2 long axis L hip distraction x 30 x 3 with grade 1 inf thrust on last rep Bridges with hip abd, GTB x 3 x 10 x 2 SLR x 10 x 2 with 2 lbs Mini squats with GTB on thighs x 3 x 10 x 2   02/24/23 Pelvic alignment; Muscle energy technique for Rt anterior rotation Standing:  plantar fascia stretch/hamstring stretch (demo/pt is doing these at home)  Lumbar extension (pt is doing this at home) Seated:  piriformis stretch 3X30 each (Lt much tighter than Rt) Supine: Bridge bil keeping hips even 10X  Rt LE bridge only 10X  Lt LE SLR 10X  Abdominal/pelvic isometric 10X5  Hip adduction isometric with pillow 10X5 Side lying hip abduction 10X each Prone hip extension 10X  02/19/23 Evaluation and patient education done                                                                                                                               PATIENT EDUCATION:  Education details: Educated on the pathoanatomy of low back pain. Educated on the goals and course of rehab.  Person educated: Patient Education method: Explanation Education comprehension: verbalized understanding  HOME EXERCISE PROGRAM: None provided at evaluation  Access Code: [email or web address] URL: [email or web address] 03/10/23 - Cat Cow  - 1 x daily - 7 x weekly - 20 reps - Child's Pose with Sidebending  - 1 x daily - 7 x weekly - 3 reps - 30 hold - Supine Lower Trunk Rotation  - 1 x daily - 7 x weekly - 5 reps - 15  hold  03/03/23 - Mini Squat  - 2 x daily - 7 x weekly - 2 sets - 10 reps - 3 hold  Date: 02/24/2023 Prepared by: Greig Fuse  Exercises - Supine Bridge  - 3 x daily - 7 x weekly - 10 reps - 3 sec hold - Single Leg Bridge  - 3 x daily - 7 x weekly - 10 reps - Supine Transversus Abdominis Bracing with Pelvic Floor Contraction  - 3 x daily - 7 x weekly - 10  reps - 5 sec hold - Straight Leg Raise  - 3 x daily - 7 x weekly - 10 reps - Sidelying Hip Abduction  - 3 x daily - 7 x weekly - 10 reps - Supine Hip Adduction Isometric with Ball  - 3 x daily - 7 x weekly - 10 reps - 5 sec hold - Prone Hip Extension  - 3 x daily - 7 x weekly - 10 reps - Seated Piriformis Stretch with Trunk Bend  - 3 x daily - 7 x weekly - 3 reps - 30 sec hold - Seated Table Hamstring Stretch  - 3 x daily - 7 x weekly - 10 reps - 30 sec hold - Plantar Fascia Stretch on Step  - 3 x daily - 7 x weekly - 3 reps - 30 sec hold ASSESSMENT:  CLINICAL IMPRESSION: Interventions today were geared towards hip strengthening and mobility. Tolerated all activities without worsening of symptoms. Manual therapy was done as B medial malleoli are still not level. B ASIS and medial malleoli after the manual therapy technique. Demonstrated appropriate levels of fatigue. Provided little to no amount of cueing to ensure correct execution of activity with good carry-over. To date, skilled PT is required to address the impairments and improve function.   EVAL: Patient is a 58 y.o. female who was seen today for physical therapy evaluation and treatment for idiopathic neuropathy and lumbar pain. Based on the objective findings, patient presents with slight R pelvic upslip. Patient's condition is further defined by difficulty with squatting and bending over due to pain, weakness, and decreased soft tissue extensibility. Skilled PT is required to address the impairments and functional limitations listed below.    OBJECTIVE IMPAIRMENTS: decreased  strength, impaired flexibility, postural dysfunction, and pain.   ACTIVITY LIMITATIONS: bending and squatting  PARTICIPATION LIMITATIONS:  recreation and taking care of granddaughter  PERSONAL FACTORS: Time since onset of injury/illness/exacerbation are also affecting patient's functional outcome.   REHAB POTENTIAL: Good  CLINICAL DECISION MAKING: Stable/uncomplicated  EVALUATION COMPLEXITY: Low   GOALS: Goals reviewed with patient? No  SHORT TERM GOALS: Target date: 03/05/23  Pt will demonstrate indep in HEP to facilitate carry-over of skilled services and improve functional outcomes Goal status: INITIAL  LONG TERM GOALS: Target date: 03/19/23  Pt will increase LEFS by at least 9 points in order to demonstrate significant improvement in lower extremity function. Baseline: 46/80 Goal status: INITIAL  2.  Pt will demonstrate increase in LE strength to 5/5 to facilitate ease and safety in ambulation Baseline: 4+/5 Goal status: INITIAL  3.  Pt will be able to squat and bend over with little pain (1-2/10) to facilitate ease in recreation and taking care of granddaughter. Baseline:6/10 Goal status: INITIAL   PLAN:  PT FREQUENCY: 1x/week  PT DURATION: 4 weeks  PLANNED INTERVENTIONS: 97164- PT Re-evaluation, 97110-Therapeutic exercises, 97530- Therapeutic activity, [id number]- Neuromuscular re-education, 97535- Self Care, [id number]- Manual therapy, [phone]- Gait training, and Patient/Family education.  PLAN FOR NEXT SESSION: Progress with core/hip strengthening. Re-check pelvic alignment.  Progress abdominal/core stab using physioball as pt has one at home. Re-assess next visit.   Vinie CROME. Liann Spaeth, PT, DPT, OCS [facility] Ph: [phone] 03/10/2023, 8:31 AM

## 2023-03-17 ENCOUNTER — Ambulatory Visit (HOSPITAL_COMMUNITY): Payer: Managed Care, Other (non HMO)

## 2023-03-17 DIAGNOSIS — M5459 Other low back pain: Secondary | ICD-10-CM | POA: Diagnosis not present

## 2023-03-17 DIAGNOSIS — G8929 Other chronic pain: Secondary | ICD-10-CM

## 2023-03-17 NOTE — Addendum Note (Signed)
Addended by: Trudee Grip on: 03/17/2023 03:44 PM   Modules accepted: Orders

## 2023-03-17 NOTE — Therapy (Addendum)
OUTPATIENT PHYSICAL THERAPY PROGRESS NOTE   Patient Name: Olivia Johnson MRN: [id number] DOB:04-Oct-1965, 58 y.o., female Today's Date: 03/17/2023  END OF SESSION:  PT End of Session - 03/17/23 0837     Visit Number 5    Number of Visits 9    Date for PT Re-Evaluation 04/14/23    Authorization Type [employer] Managed (60 visit limits)    PT Start Time 0800    PT Stop Time 0835    PT Time Calculation (min) 35 min    Activity Tolerance Patient tolerated treatment well             Past Medical History:  Diagnosis Date   Raynaud's syndrome 03/17/2019   Has had this since teenager years mainly in the hands some in the toes   Past Surgical History:  Procedure Laterality Date   COLONOSCOPY N/A 09/17/2016   Procedure: COLONOSCOPY;  Surgeon: Corbin Ade, MD;  [facility];  Service: Endoscopy;  Laterality: N/A;  2:30 pm   Patient Active Problem List   Diagnosis Date Noted   Raynaud's syndrome 03/17/2019   Vitamin D insufficiency 07/13/2016   Progress Note Reporting Period 02/19/23 to 03/17/23  See note below for Objective Data and Assessment of Progress/Goals.      PCP: Babs Sciara, MD  REFERRING PROVIDER: Babs Sciara, MD  REFERRING DIAG: G60.9 (ICD-10-CM) - Idiopathic peripheral neuropathy M54.50 (ICD-10-CM) - Lumbar pain  Rationale for Evaluation and Treatment: Rehabilitation  THERAPY DIAG:  Other low back pain  Chronic pain of right knee  ONSET DATE: Summer 2024  SUBJECTIVE:                                                                                                                                                                                           SUBJECTIVE STATEMENT: PROGRESS NOTE 03/17/23: Plantar fasciitis is getting better. Denies knee pain at the moment but still feels pain on the inner aspect of the L knee when she crosses her leg in sitting = 3/10. Low back pain still persists = 5/10 when she's hunched or bent forward. Patient  thinks that she's a lot better now and she has improved 75% for the knee and 10% for the back. Patient wants to work more on her back and knee and continue with PT. However, patient also wants to work on her plantar fasciitis. Patient has been doing her HEP as directed and has no issues with them. Patient reports that she feels that she's not yet ready to do backpacking and hiking.  EVAL: Arrives to the clinic with low back pain and R knee pain (  see below). Patient states that she is more concerned about her knee at present. Denies any weakness on the legs but reports of some numbness on the feet. Condition on the back and the knee started summer of 2024 without apparent reason but attributes pain to a lot of physical activity (e.g. hiking). Patient rested the area but the pain is not going away. Patient then went to her MD recently. No imaging was done and no meds were given. Patient was just referred to outpatient PT evaluation and management  PERTINENT HISTORY:  Raynaud's phenomenon, R plantar fasciitis  PAIN:  Are you having pain? Yes: NPRS scale: none at the time of evaluation but can be 6/10 on aggravating factors Pain location: low back, medial side of the R knee Pain description: soreness Aggravating factors: bending over, turning your foot out, squatting Relieving factors: sitting, arching back  PRECAUTIONS: Other: Raynaud's phenomenon  RED FLAGS: None   WEIGHT BEARING RESTRICTIONS: No  FALLS:  Has patient fallen in last 6 months? No  LIVING ENVIRONMENT: Lives with: lives with their spouse Lives in: House/apartment Stairs: Yes: External: 4 steps; on right going up and on left going up Has following equipment at home: None  OCCUPATION: administrative (1x/day)  PLOF: Independent and Independent with basic ADLs  PATIENT GOALS: "to be able to hike and caring with grand daughter"  NEXT MD VISIT: April 09, 2023  OBJECTIVE:  Note: Objective measures were completed at  Evaluation unless otherwise noted.  DIAGNOSTIC FINDINGS:  None performed relevant to the area/complaint recently   PATIENT SURVEYS:  Modified Oswestry 03/17/23: 5/50 = 10%  LEFS 03/17/23: 58/80 = 72.5% from 46/80 = 57.5%  COGNITION: Overall cognitive status: Within functional limits for tasks assessed     SENSATION: Light touch: Impaired on L LE  MUSCLE LENGTH: Hamstrings: moderate restriction on B Thomas test: WFL Piriformis: mild restriction on R, WFL on L ITB: mild restriction on B  POSTURE:  standing: R foot medial arch slightly elevated, R iliac crest higher, R foot in slight ER, slight R genu recurvatum, R LE is shorter in supine, L LE did not get short in supine to sit  03/17/23: B medial malleoli are now level in supine  PALPATION: Grade 1 tenderness on L3-L4 facet joints and spinous process Grade 1 tenderness on the R medial joint line Normal patellar mobility on all motions on B  LUMBAR ROM:   AROM eval 03/17/23  Flexion WFL WFL*  Extension WFL   Right lateral flexion    Left lateral flexion    Right rotation WFL   Left rotation WFL    (Blank rows = not tested) *with pain on the spinous process around L3-L4  LOWER EXTREMITY ROM:     Active  Right eval Left eval  Hip flexion [facility]  Hip extension [facility]  Hip abduction [facility]  Hip adduction    Hip internal rotation    Hip external rotation    Knee flexion [facility]  Knee extension [facility]  Ankle dorsiflexion [facility]  Ankle plantarflexion [facility]  Ankle inversion    Ankle eversion     (Blank rows = not tested)  LOWER EXTREMITY MMT:    MMT Right eval Left eval Right 03/17/23 Left 03/17/23  Hip flexion 4+ 4+ 5 5  Hip extension 4+ 4+ 5 5  Hip abduction 4+ 4+ 5 5  Hip adduction      Hip internal rotation      Hip external  rotation      Knee flexion 5 5 5 5   Knee extension 5 5 5 5   Ankle dorsiflexion 5 5 5 5   Ankle plantarflexion 5 5 5 5   Ankle inversion      Ankle eversion       (Blank  rows = not tested)  LUMBAR SPECIAL TESTS:  Straight leg raise test: Negative for radicular symptoms  KNEE SPECIAL TESTS:    (-) Apley's compression test on B   Valgus/varus stress test aggravates R knee pain  FUNCTIONAL TESTS:  5 times sit to stand: 9.86 sec 2 minute walk test: 708 ft 03/17/23: able to do a full deep squat with 1/10 pain on R knee, able to do forward bending in standing with 4/10 if she "pushes hard"  GAIT: Distance walked: 708 ft Assistive device utilized: None Level of assistance: Complete Independence Comments: done with  TREATMENT DATE:  03/17/23 Progress note (functional tests, MMT, leg length, modified Oswestry, LEFS)  03/10/23 Elliptical, level 1 x 5' Seated hamstring stretch x 30" x 3 Seated piriformis stretch x 30" x 3 Grade 2 long axis R hip distraction x 30" x 3 with grade 1 inf thrust on last rep Seated trunk forward and lat flex x 30" x 2 Cat and cow x 20 Bridges with hip abd, GTB x 3" x 10 x 2 SLR x 10 x 2 with 2 lbs Mini squats with GTB on thighs x 3 "x 10 x 2  03/03/23 Elliptical, level 1 x 5' Seated hamstring stretch x 30" x 3 Seated piriformis stretch x 30" x 3 Patient education on pain neuroscience - patient gave excellent understanding Grade 2 long axis L hip distraction x 30" x 3 with grade 1 inf thrust on last rep Bridges with hip abd, GTB x 3" x 10 x 2 SLR x 10 x 2 with 2 lbs Mini squats with GTB on thighs x 3 "x 10 x 2  03/03/23 Elliptical, level 1 x 5' Seated hamstring stretch x 30" x 3 Seated piriformis stretch x 30" x 3 Patient education on pain neuroscience - patient gave excellent understanding Grade 2 long axis L hip distraction x 30" x 3 with grade 1 inf thrust on last rep Bridges with hip abd, GTB x 3" x 10 x 2 SLR x 10 x 2 with 2 lbs Mini squats with GTB on thighs x 3 "x 10 x 2   02/24/23 Pelvic alignment; Muscle energy technique for Rt anterior rotation Standing:  plantar fascia stretch/hamstring stretch  (demo/pt is doing these at home)  Lumbar extension (pt is doing this at home) Seated:  piriformis stretch 3X30" each (Lt much tighter than Rt) Supine: Bridge bil keeping hips even 10X  Rt LE bridge only 10X  Lt LE SLR 10X  Abdominal/pelvic isometric 10X5"  Hip adduction isometric with pillow 10X5" Side lying hip abduction 10X each Prone hip extension 10X  02/19/23 Evaluation and patient education done  PATIENT EDUCATION:  Education details: Educated on the pathoanatomy of low back pain. Educated on the goals and course of rehab.  Person educated: Patient Education method: Explanation Education comprehension: verbalized understanding  HOME EXERCISE PROGRAM: None provided at evaluation  Access Code: [email or web address] URL: [email or web address] 03/10/23 - Cat Cow  - 1 x daily - 7 x weekly - 20 reps - Child's Pose with Sidebending  - 1 x daily - 7 x weekly - 3 reps - 30 hold - Supine Lower Trunk Rotation  - 1 x daily - 7 x weekly - 5 reps - 15 hold  03/03/23 - Mini Squat  - 2 x daily - 7 x weekly - 2 sets - 10 reps - 3 hold  Date: 02/24/2023 Prepared by: Emeline Gins  Exercises - Supine Bridge  - 3 x daily - 7 x weekly - 10 reps - 3 sec hold - Single Leg Bridge  - 3 x daily - 7 x weekly - 10 reps - Supine Transversus Abdominis Bracing with Pelvic Floor Contraction  - 3 x daily - 7 x weekly - 10 reps - 5 sec hold - Straight Leg Raise  - 3 x daily - 7 x weekly - 10 reps - Sidelying Hip Abduction  - 3 x daily - 7 x weekly - 10 reps - Supine Hip Adduction Isometric with Ball  - 3 x daily - 7 x weekly - 10 reps - 5 sec hold - Prone Hip Extension  - 3 x daily - 7 x weekly - 10 reps - Seated Piriformis Stretch with Trunk Bend  - 3 x daily - 7 x weekly - 3 reps - 30 sec hold - Seated Table Hamstring Stretch  - 3 x daily - 7 x weekly - 10 reps - 30 sec hold -  Plantar Fascia Stretch on Step  - 3 x daily - 7 x weekly - 3 reps - 30 sec hold ASSESSMENT:  CLINICAL IMPRESSION: PROGRESS NOTE 03/17/23: Patient demonstrated continued improvements in function as indicated by positive significant changes in LEFS and strength. Patient now presents with equal leg length. However, patient still reports of pain when she's bending forward and crossing her legs when sitting on the floor. In addition, patient's strength does not translate to function with bending and squatting. Patient also states that she's not yet ready to go back to hiking/backpacking. With this, skilled PT is still required to address the impairments and functional limitations listed below. Had discussion with patient today about her plantar fasciitis. Patient is going to discuss with her referring provider to see if patient will benefit from PT especially with dry needling.  EVAL: Patient is a 58 y.o. female who was seen today for physical therapy evaluation and treatment for idiopathic neuropathy and lumbar pain. Based on the objective findings, patient presents with slight R pelvic upslip. Patient's condition is further defined by difficulty with squatting and bending over due to pain, weakness, and decreased soft tissue extensibility. Skilled PT is required to address the impairments and functional limitations listed below.    OBJECTIVE IMPAIRMENTS: impaired perceived functional ability and pain.   ACTIVITY LIMITATIONS: bending and squatting  PARTICIPATION LIMITATIONS:  recreation and taking care of granddaughter  PERSONAL FACTORS: Time since onset of injury/illness/exacerbation are also affecting patient's functional outcome.   REHAB POTENTIAL: Good  GOALS: Goals reviewed with patient? No  SHORT TERM GOALS: Target date: 03/05/23  Pt will demonstrate indep in HEP to facilitate carry-over of skilled services and improve functional  outcomes Goal status: MET  LONG TERM GOALS: Target date:  04/14/23  Pt will increase LEFS by at least 9 points in order to demonstrate significant improvement in lower extremity function. Baseline: 46/80; 03/17/23: 58/80 Goal status: IN PROGRESS  2.  Pt will demonstrate increase in LE strength to 5/5 to facilitate ease and safety in ambulation Baseline: 4+/5 Goal status: MET  3.  Pt will be able to squat and bend over with little pain (1-2/10) to facilitate ease in recreation and taking care of granddaughter. Baseline:6/10 Goal status:  MET  4.  Pt will be around 80-90% confident and ready for hiking with little pain (1-2/10) Baseline: Goal status:  NEW GOAL    PLAN:  PT FREQUENCY: 1x/week  PT DURATION: 4 weeks  PLANNED INTERVENTIONS: [id number]- PT Re-evaluation, 97110-Therapeutic exercises, 97530- Therapeutic activity, [id number]- Neuromuscular re-education, 97535- Self Care, [id number]- Manual therapy, [phone]- Gait training, and Patient/Family education.  PLAN FOR NEXT SESSION: Continue POC and may progress as tolerated with emphasis on  core/hip strengthening. Assess foot if referral is available   Iantha Fallen L. Elder Negus, PT, DPT, OCS [facility] Ph: [phone] 03/17/2023, 9:42 AM

## 2023-04-09 ENCOUNTER — Ambulatory Visit (INDEPENDENT_AMBULATORY_CARE_PROVIDER_SITE_OTHER): Payer: Managed Care, Other (non HMO) | Admitting: Family Medicine

## 2023-04-09 VITALS — BP 126/77 | HR 72 | Temp 98.8°F | Ht 67.5 in | Wt 138.4 lb

## 2023-04-09 DIAGNOSIS — H9313 Tinnitus, bilateral: Secondary | ICD-10-CM | POA: Diagnosis not present

## 2023-04-09 DIAGNOSIS — G609 Hereditary and idiopathic neuropathy, unspecified: Secondary | ICD-10-CM | POA: Diagnosis not present

## 2023-04-09 DIAGNOSIS — R931 Abnormal findings on diagnostic imaging of heart and coronary circulation: Secondary | ICD-10-CM | POA: Diagnosis not present

## 2023-04-09 DIAGNOSIS — E785 Hyperlipidemia, unspecified: Secondary | ICD-10-CM

## 2023-04-09 NOTE — Progress Notes (Signed)
 Subjective:    Patient ID: Olivia Johnson, female    DOB: 1965-11-08, 58 y.o.   MRN: [id number]  Discussed the use of AI scribe software for clinical note transcription with the patient, who gave verbal consent to proceed.  History of Present Illness   Olivia BRAN "Darel Hong" is a 58 year old female who presents with persistent neuropathy and tinnitus.  She experiences persistent neuropathy, which has worsened since her last visit. The sensation is consistent, affecting her toes and extending into her calves. It does not worsen with walking but is more noticeable when sitting. No family history of neuropathy is noted, and she has not experienced any balance problems. Previous lab work showed normal methylmalonic acid and an A1c of 5.7, indicating prediabetes but not typically associated with neuropathy.  She has longstanding tinnitus, which has become more bothersome recently. The tinnitus is bilateral and constant, audible even in normal room noise. She has no significant history of exposure to loud noises and does not use white noise at night, as it competes with the tinnitus. Her father also has tinnitus.  She experiences plantar fasciitis, which flares up with hiking but calms down quickly. She manages it with stretches, strengthening exercises, and using a golf ball for massage. She reports some tightness in the morning but no significant pain when getting up.    She has a history of elevated cholesterol and has not started any medications for it. She is concerned about potential side effects. Her LDL cholesterol was noted to be high, and she is aware of a coronary calcium spot in the left anterior descending artery.  Her mother recently had a mini-stroke, which she witnessed at a family event. This has been a source of concern for her, but she focuses on positive aspects of the event.         Review of Systems     Objective:    Physical Exam     Left ear thickened area           Assessment & Plan:  Assessment and Plan    Neuropathy Symptoms worsened, extending into calves. Normal methylmalonic acid and A1c. Discussed gabapentin, Lyrica, Cymbalta for pain management. Medications may cause drowsiness or cognitive effects in 20% of patients. - Refer to neurology for nerve conduction study.  Tinnitus Long-standing, bothersome tinnitus. No significant hearing loss. Discussed ENT evaluation and management options like cognitive behavioral therapy and white noise. - Refer to ENT for audiology evaluation.  Coronary artery disease Coronary calcium score showed spot in left anterior descending artery. Emphasized LDL management. Discussed statins and alternatives like Repatha. Recommended cardiology consultation. - Refer to cardiology for further evaluation and management. - Order cholesterol profile and lipoprotein A test.  Plantar fasciitis Flares with hiking, managed with stretching, strengthening, massage. Symptoms improving.  General Health Maintenance Engaging in physical activity. Dermatology follow-up needed for possible actinic keratosis. Discussed sun protection. - Contact dermatologist for evaluation of rough spot on ear. - Use at least SPF 50 sunscreen during outdoor activities.  Follow-up Discussed referral process and importance of follow-up. - Follow up with MyChart message if no contact from specialists within 2-3 weeks.     1. Elevated coronary artery calcium score (Primary) It is very important to get LDL below 55 if possible.  Patient very hesitant about going on statin due to potential side effects.  She agrees to see cardiologist for their input regarding this issue.  Patient denies any angina symptoms.  She plans  on hiking [facility] later this year. - Ambulatory referral to Cardiology  2. Hyperlipidemia, unspecified hyperlipidemia type The goal is to get LDL below 70 and if possible below 55 Recommended statin patient  hesitant She is willing to see cardiology to discuss this Up-to-date lab work ordered. - Lipid Panel - Lipoprotein A (LPA)  3. Idiopathic peripheral neuropathy This is now being progressive lab work overall negative.  Did show prediabetes but that is unlikely to be the cause since she has had the symptoms for  months but seem to be getting worse now radiating up into her calf we did discuss medications and how medicines does not cure this but could help with the symptoms she defers on medicines currently we will do consultation with neurology more than likely nerve conduction study - Ambulatory referral to Neurology  4. Tinnitus of both ears Having significant tinnitus of both ears enough to disturb sleep we did discuss ways to mask this.  Also recommend referral to ENT for further evaluation - Ambulatory referral to ENT

## 2023-04-13 ENCOUNTER — Other Ambulatory Visit: Payer: Self-pay

## 2023-04-13 DIAGNOSIS — R931 Abnormal findings on diagnostic imaging of heart and coronary circulation: Secondary | ICD-10-CM

## 2023-04-13 DIAGNOSIS — E785 Hyperlipidemia, unspecified: Secondary | ICD-10-CM

## 2023-04-17 ENCOUNTER — Other Ambulatory Visit: Payer: Self-pay | Admitting: Family Medicine

## 2023-04-17 ENCOUNTER — Encounter: Payer: Self-pay | Admitting: Family Medicine

## 2023-04-17 MED ORDER — AMOXICILLIN-POT CLAVULANATE 875-125 MG PO TABS: 1.0000 | ORAL_TABLET | Freq: Two times a day (BID) | ORAL | 0 refills | Status: DC

## 2023-06-30 ENCOUNTER — Ambulatory Visit (INDEPENDENT_AMBULATORY_CARE_PROVIDER_SITE_OTHER): Admitting: Audiology

## 2023-06-30 ENCOUNTER — Ambulatory Visit (INDEPENDENT_AMBULATORY_CARE_PROVIDER_SITE_OTHER): Admitting: Otolaryngology

## 2023-06-30 ENCOUNTER — Encounter (INDEPENDENT_AMBULATORY_CARE_PROVIDER_SITE_OTHER): Payer: Self-pay | Admitting: Otolaryngology

## 2023-06-30 VITALS — BP 132/84 | HR 88 | Ht 67.0 in | Wt 135.0 lb

## 2023-06-30 DIAGNOSIS — H9313 Tinnitus, bilateral: Secondary | ICD-10-CM | POA: Diagnosis not present

## 2023-06-30 DIAGNOSIS — R931 Abnormal findings on diagnostic imaging of heart and coronary circulation: Secondary | ICD-10-CM | POA: Insufficient documentation

## 2023-06-30 DIAGNOSIS — H903 Sensorineural hearing loss, bilateral: Secondary | ICD-10-CM | POA: Diagnosis not present

## 2023-06-30 DIAGNOSIS — E785 Hyperlipidemia, unspecified: Secondary | ICD-10-CM | POA: Insufficient documentation

## 2023-06-30 DIAGNOSIS — R911 Solitary pulmonary nodule: Secondary | ICD-10-CM | POA: Insufficient documentation

## 2023-06-30 NOTE — Progress Notes (Unsigned)
 Cardiology Office Note   Date:  07/02/2023   ID:  Olivia Johnson, DOB Jun 10, 1965, MRN [id number]  PCP:  Bennet Brasil, MD  Cardiologist:   Eilleen Grates, MD Referring:  Bennet Brasil, MD  No chief complaint on file.     History of Present Illness: Olivia Johnson is a 58 y.o. female who presents for evaluation of an elevated coronary calcium .   She was referred by Bennet Brasil, MD   The calcium  score was 125 which was 91% of predicted.  There is also a small pulmonary nodule.  She does have an elevated LDL 529 with an HDL of 67.  She was given a prescription for statin but did not yet want to start this.  She is very active.  She does hiking with heavy backpack. The patient denies any new symptoms such as chest discomfort, neck or arm discomfort. There has been no new shortness of breath, PND or orthopnea. There have been no reported palpitations, presyncope or syncope.    Past Medical History:  Diagnosis Date   Elevated coronary artery calcium  score    Hyperlipidemia    Idiopathic peripheral neuropathy    Raynaud's syndrome 03/17/2019   Has had this since teenager years mainly in the hands some in the toes   Tinnitus of both ears     Past Surgical History:  Procedure Laterality Date   COLONOSCOPY N/A 09/17/2016   Procedure: COLONOSCOPY;  Surgeon: Suzette Espy, MD;  Location: [facility];  Service: Endoscopy;  Laterality: N/A;  2:30 pm     Current Outpatient Medications  Medication Sig Dispense Refill   rosuvastatin  (CRESTOR ) 10 MG tablet Take 1 tablet (10 mg total) by mouth daily. (Patient not taking: Reported on 04/09/2023) 90 tablet 1   No current facility-administered medications for this visit.    Allergies:   Oxycodone     Social History:  The patient  reports that she has never smoked. She has never used smokeless tobacco. She reports current alcohol use. She reports that she does not use drugs.   Family History:  The patient's family history  includes Hyperlipidemia in her father; Transient ischemic attack in her mother.    ROS:  Please see the history of present illness.   Otherwise, review of systems are positive for none.   All other systems are reviewed and negative.    PHYSICAL EXAM: VS:  BP 102/70   Pulse (!) 106   Ht 5\' 7"  (1.702 m)   Wt 139 lb (63 kg)   LMP 04/20/2015   SpO2 96%   BMI 21.77 kg/m  , BMI Body mass index is 21.77 kg/m. GENERAL:  Well appearing HEENT:  Pupils equal round and reactive, fundi not visualized, oral mucosa unremarkable NECK:  No jugular venous distention, waveform within normal limits, carotid upstroke brisk and symmetric, no bruits, no thyromegaly LYMPHATICS:  No cervical, inguinal adenopathy LUNGS:  Clear to auscultation bilaterally BACK:  No CVA tenderness CHEST:  Unremarkable HEART:  PMI not displaced or sustained,S1 and S2 within normal limits, no S3, no S4, no clicks, no rubs, no murmurs ABD:  Flat, positive bowel sounds normal in frequency in pitch, no bruits, no rebound, no guarding, no midline pulsatile mass, no hepatomegaly, no splenomegaly EXT:  2 plus pulses throughout, no edema, no cyanosis no clubbing SKIN:  No rashes no nodules NEURO:  Cranial nerves II through XII grossly intact, motor grossly intact throughout PSYCH:  Cognitively intact, oriented to  person place and time    EKG:  EKG Interpretation Date/Time:  Thursday Jul 02 2023 08:29:04 EDT Ventricular Rate:  108 PR Interval:  118 QRS Duration:  94 QT Interval:  338 QTC Calculation: 452 R Axis:   59  Text Interpretation: Sinus tachycardia RSR' or QR pattern in V1 suggests right ventricular conduction delay No previous ECGs available Confirmed by Eilleen Grates ([id number]) on 07/02/2023 8:38:42 AM     Recent Labs: 02/11/2023: ALT 17; BUN 13; Creatinine, Ser 0.70; Potassium 4.6; Sodium 144    Lipid Panel    Component Value Date/Time   CHOL 193 06/30/2023 0830   TRIG 108 06/30/2023 0830   HDL 61 06/30/2023  0830   CHOLHDL 3.2 06/30/2023 0830   LDLCALC 113 (H) 06/30/2023 0830      Wt Readings from Last 3 Encounters:  07/02/23 139 lb (63 kg)  06/30/23 135 lb (61.2 kg)  04/09/23 138 lb 6.4 oz (62.8 kg)      Other studies Reviewed: Additional studies/ records that were reviewed today include: Labs, coronary calcium  score. Review of the above records demonstrates:  Please see elsewhere in the note.     ASSESSMENT AND PLAN:   Elevated coronary calcium : The patient has coronary disease by definition. I will bring the patient back for a POET (Plain Old Exercise Test). This will allow me to screen for obstructive coronary disease, risk stratify and very importantly provide a prescription for exercise.  Provide this shows no high risk findings I would continue with aggressive risk reduction.  We had a long discussion about this.  Lung nodule:    She needs a repeat CT in 12 months.    Per Bennet Brasil, MD   Dyslipidemia: We a long discussion about this.  She is already following a healthy lifestyle.  I agree with starting statin with a goal LDL in my mind in the 50s.  LP(a) was normal.  She will begin this and have follow-up by Dr. Fairy Homer.  Current medicines are reviewed at length with the patient today.  The patient does not have concerns regarding medicines.  The following changes have been made:  no change  Labs/ tests ordered today include:   Orders Placed This Encounter  Procedures   EXERCISE TOLERANCE TEST (ETT)   EKG 12-Lead     Disposition:   FU with me as needed.     Signed, Eilleen Grates, MD  07/02/2023 9:22 AM    [facility]

## 2023-06-30 NOTE — Progress Notes (Unsigned)
 CC: Bilateral tinnitus  HPI:  Olivia Johnson is a 58 y.o. female who presents today complaining of bilateral tinnitus for 5+ years.  The patient describes her tinnitus as a constant high-pitched ringing noise.  It is nonpulsatile.  She has no recent otitis media or otitis externa.  She has no previous otologic surgery.  She denies any significant hearing difficulty.  Currently she denies any otalgia, otorrhea, or vertigo.  Past Medical History:  Diagnosis Date   Elevated coronary artery calcium  score    Hyperlipidemia    Idiopathic peripheral neuropathy    Raynaud's syndrome 03/17/2019   Has had this since teenager years mainly in the hands some in the toes   Tinnitus of both ears     Past Surgical History:  Procedure Laterality Date   COLONOSCOPY N/A 09/17/2016   Procedure: COLONOSCOPY;  Surgeon: Suzette Espy, MD;  [facility];  Service: Endoscopy;  Laterality: N/A;  2:30 pm    History reviewed. No pertinent family history.  Social History:  reports that she has never smoked. She has never used smokeless tobacco. She reports current alcohol use. She reports that she does not use drugs.  Allergies:  Allergies  Allergen Reactions   Oxycodone  Other (See Comments)    nightmares    Prior to Admission medications   Medication Sig Start Date End Date Taking? Authorizing Provider  amoxicillin -clavulanate (AUGMENTIN ) 875-125 MG tablet Take 1 tablet by mouth 2 (two) times daily. 04/17/23   Bennet Brasil, MD  rosuvastatin  (CRESTOR ) 10 MG tablet Take 1 tablet (10 mg total) by mouth daily. Patient not taking: Reported on 06/30/2023 02/16/23   Bennet Brasil, MD    Blood pressure 132/84, pulse 88, height 5\' 7"  (1.702 m), weight 135 lb (61.2 kg), last menstrual period 04/20/2015, SpO2 97%. Exam: General: Communicates without difficulty, well nourished, no acute distress. Head: Normocephalic, no evidence injury, no tenderness, facial buttresses intact without stepoff.  Face/sinus: No tenderness to palpation and percussion. Facial movement is normal and symmetric. Eyes: PERRL, EOMI. No scleral icterus, conjunctivae clear. Neuro: CN II exam reveals vision grossly intact.  No nystagmus at any point of gaze. Ears: Auricles well formed without lesions.  Ear canals are intact without mass or lesion.  No erythema or edema is appreciated.  The TMs are intact without fluid. Nose: External evaluation reveals normal support and skin without lesions.  Dorsum is intact.  Anterior rhinoscopy reveals normal mucosa over anterior aspect of inferior turbinates and intact septum.  No purulence noted. Oral:  Oral cavity and oropharynx are intact, symmetric, without erythema or edema.  Mucosa is moist without lesions. Neck: Full range of motion without pain.  There is no significant lymphadenopathy.  No masses palpable.  Thyroid bed within normal limits to palpation.  Parotid glands and submandibular glands equal bilaterally without mass.  Trachea is midline. Neuro:  CN 2-12 grossly intact.   Her hearing test shows bilateral symmetric mild high-frequency sensorineural hearing loss.  Assessment: 1.  Bilateral symmetric mild high-frequency sensorineural hearing loss. 2.  Her bilateral tinnitus is likely a direct result of her hearing loss. 3.  The patient's ear canals, tympanic membranes, and middle ear spaces are normal.  Plan: 1.  The physical exam findings and the hearing test results are reviewed with the patient. 2.  The pathophysiology of tinnitus is reviewed with the patient.   3.  The strategies to cope with tinnitus, including the use of masker, hearing aids, tinnitus retraining therapy, and  avoidance of caffeine and alcohol are extensively discussed. 4.  The patient is a candidate for hearing amplification. 5.  The patient will return for reevaluation in 1 year with repeat audiogram.   Gustava Berland W Nikola Blackston 06/30/2023, 1:59 PM

## 2023-06-30 NOTE — Progress Notes (Signed)
  [address] [place], [place] [address] [phone]  Audiological Evaluation    Name: Olivia Johnson     DOB:   [birth year]      MRN:   [id number]                                                                                     Service Date: 06/30/2023     Accompanied by: unaccompanied   Patient comes today after Dr. Darlin Ehrlich, ENT sent a referral for a hearing evaluation due to concerns with tinnitus.   Symptoms Yes Details  Hearing loss  []    Tinnitus  [x]  Constant in both ears  Ear pain/ infections/pressure  []    Balance problems  []    Noise exposure history  []    Previous ear surgeries  []    Family history of hearing loss  [x]  Sister and parents with age  Amplification  []    Other  []      Otoscopy: Right ear: Clear external ear canals and notable landmarks visualized on the tympanic membrane. Left ear:  Clear external ear canals and notable landmarks visualized on the tympanic membrane.  Tympanometry: Right ear: Type Ad- Normal external ear canal volume with normal middle ear pressure and high tympanic membrane compliance. Left ear: Type Ad- Normal external ear canal volume with normal middle ear pressure and high tympanic membrane compliance.    Pure tone Audiometry: Right ear- Normal hearing from [phone] Hz, then mild sensorineural hearing loss at 8000 Hz.   Left ear-  Normal hearing from [phone] Hz, then mild sensorineural hearing loss at 8000 Hz.   Speech Audiometry: Right ear- Speech Reception Threshold (SRT) was obtained at 10 dBHL. Left ear-Speech Reception Threshold (SRT) was obtained at 10 dBHL.   Word Recognition Score Tested using NU-6 (MLV) Right ear: 100% was obtained at a presentation level of 50 dBHL with contralateral masking which is deemed as  excellent. Left ear: 100% was obtained at a presentation level of 50 dBHL with contralateral masking which is deemed as  excellent.   The hearing test results were completed under headphones  and re-checked with inserts and results are deemed to be of good reliability. Test technique:  conventional      Recommendations: Follow up with ENT as scheduled for today. Return for a hearing evaluation if concerns with hearing changes arise or per MD recommendation. Consider various tinnitus strategies, including the use of a sound generator,  and/or tinnitus retraining therapy. Patient ma consider going to [facility] tinnitus clinic.   Zanyla Klebba MARIE LEROUX-MARTINEZ, AUD

## 2023-07-01 ENCOUNTER — Ambulatory Visit: Payer: Self-pay | Admitting: Family Medicine

## 2023-07-01 DIAGNOSIS — H903 Sensorineural hearing loss, bilateral: Secondary | ICD-10-CM | POA: Insufficient documentation

## 2023-07-01 DIAGNOSIS — H9313 Tinnitus, bilateral: Secondary | ICD-10-CM | POA: Insufficient documentation

## 2023-07-01 LAB — LIPID PANEL
Chol/HDL Ratio: 3.2 ratio (ref 0.0–4.4)
Cholesterol, Total: 193 mg/dL (ref 100–199)
HDL: 61 mg/dL (ref >39)
LDL Chol Calc (NIH): 113 mg/dL — ABNORMAL HIGH (ref 0–99)
Triglycerides: 108 mg/dL (ref 0–149)
VLDL Cholesterol Cal: 19 mg/dL (ref 5–40)

## 2023-07-01 LAB — LIPOPROTEIN A (LPA): Lipoprotein (a): <8.4 nmol/L (ref <75.0)

## 2023-07-02 ENCOUNTER — Ambulatory Visit: Attending: Cardiology | Admitting: Cardiology

## 2023-07-02 ENCOUNTER — Telehealth: Payer: Self-pay | Admitting: Cardiology

## 2023-07-02 ENCOUNTER — Encounter: Payer: Self-pay | Admitting: Cardiology

## 2023-07-02 VITALS — BP 102/70 | HR 106 | Ht 67.0 in | Wt 139.0 lb

## 2023-07-02 DIAGNOSIS — R911 Solitary pulmonary nodule: Secondary | ICD-10-CM | POA: Diagnosis not present

## 2023-07-02 DIAGNOSIS — R931 Abnormal findings on diagnostic imaging of heart and coronary circulation: Secondary | ICD-10-CM

## 2023-07-02 DIAGNOSIS — E785 Hyperlipidemia, unspecified: Secondary | ICD-10-CM | POA: Diagnosis not present

## 2023-07-02 NOTE — Telephone Encounter (Signed)
 Called patient to reschedule Exercise tolerant test (scheduling error). Left voicemail.

## 2023-07-02 NOTE — Patient Instructions (Addendum)
 Medication Instructions:    You can purchase this supplement over the counter Benfotiamine   No other changes  *If you need a refill on your cardiac medications before your next appointment, please call your pharmacy*   Lab Work: Not needed .   Testing/Procedures: Will be schedule Your physician has requested that you have an exercise tolerance test.. An exercise tolerance test is a test to check how your heart works during exercise. You will need to walk on a treadmill or for this test. An electrocardiogram (ECG) will record your heartbeat when you are at rest and when you are exercising.  Please also follow instruction sheet, as given.   Do not drink or eat foods with caffeine for 24 hours before the test. (Chocolate, coffee, tea, or energy drinks) If you use an inhaler, bring it with you to the test. Do not smoke for 4 hours before the test. Wear comfortable shoes and clothing.     Follow-Up: At [facility], you and your health needs are our priority.  As part of our continuing mission to provide you with exceptional heart care, we have created designated Provider Care Teams.  These Care Teams include your primary Cardiologist (physician) and Advanced Practice Providers (APPs -  Physician Assistants and Nurse Practitioners) who all work together to provide you with the care you need, when you need it.     Your next appointment:   As needed if test is normal  The format for your next appointment:   In Person  Provider:   Eilleen Grates, MD

## 2023-07-03 NOTE — Telephone Encounter (Signed)
 Routed to scheduling team to reschedule

## 2023-07-06 ENCOUNTER — Encounter: Payer: Self-pay | Admitting: Family Medicine

## 2023-07-08 ENCOUNTER — Other Ambulatory Visit: Payer: Self-pay

## 2023-07-08 DIAGNOSIS — Z79899 Other long term (current) drug therapy: Secondary | ICD-10-CM

## 2023-07-08 DIAGNOSIS — R911 Solitary pulmonary nodule: Secondary | ICD-10-CM

## 2023-07-08 DIAGNOSIS — E785 Hyperlipidemia, unspecified: Secondary | ICD-10-CM

## 2023-07-08 NOTE — Telephone Encounter (Signed)
 Nurses Please order lipid liver Patient has hyperlipidemia and liver is for medication monitoring She can do her lab work for this late July early August  Also please order plain CT scan of the chest for pulmonary nodule, please have central scheduling schedule this for late June that is a work with Marily Shows in this regards  You may also forward this message as well as the additional message to [employer]. Melanee Spire Our staff will help coordinate the above As we get results we will communicate these Please be aware with CAT scan because of the radiologist shortages nationwide [place] is able to get the scan done but it can sometimes take a couple weeks to hear results (2 to 3 weeks)  As for the blood work I would recommend doing that 8 to 10 weeks after starting your medicine  As for the heart rate this is normal for some individuals.  When you do your stress test it will be interesting to see how your heart rate responds to exercise and your recovery period after the stress test.  They are able to tell all this with the monitoring they do during the test.  It would be interesting to know what your heart rate gets up to when you are out doing any of your backpack training such as hikes  We will let you know results of test as they come to us  thanks-Dr. Geralyn Knee  Please do a follow-up late in the year would be a good idea to go ahead and set that appointment thank you

## 2023-07-15 ENCOUNTER — Telehealth: Payer: Self-pay | Admitting: Family Medicine

## 2023-07-15 NOTE — Telephone Encounter (Signed)
 Nurses Recently a CT scan of the chest was ordered due to pulmonary nodule She had this CT scan as part of a coronary calcium  scan in December 2024 Radiologist recommended a follow-up scan within 6 to 12 months Shared discussion with the patient she desired to have follow-up CT scan in June which would be 6 months This was ordered So far it is on hold with [employer] because they need more information Please connect with the referral team to make sure that they follow through at getting her CT scan approved It is medically necessary based on the findings from December 2024 Below is the assessment summary from the radiologist from December 2024-patient is aware of all of this  IMPRESSION: 1. 7 mm right lower lobe pulmonary nodule, nonspecific. Non-contrast chest CT at 6-12 months is recommended. If the nodule is stable at time of repeat CT, then future CT at 18-24 months (from today's scan) is considered optional for low-risk patients, but is recommended for high-risk patients. This recommendation follows the consensus statement: Guidelines for Management of Incidental Pulmonary Nodules Detected on CT Images: From the Fleischner Society 2017; Radiology 2017; 284:228-243.

## 2023-07-17 ENCOUNTER — Other Ambulatory Visit

## 2023-07-17 ENCOUNTER — Telehealth (HOSPITAL_COMMUNITY): Payer: Self-pay

## 2023-07-17 NOTE — Telephone Encounter (Signed)
 Spoke with the patient, detailed instructions given. [name] CCT

## 2023-07-27 ENCOUNTER — Encounter (HOSPITAL_COMMUNITY)

## 2023-07-28 ENCOUNTER — Encounter: Payer: Self-pay | Admitting: Family Medicine

## 2023-07-28 ENCOUNTER — Ambulatory Visit (HOSPITAL_COMMUNITY)

## 2023-07-31 ENCOUNTER — Ambulatory Visit
Admission: RE | Admit: 2023-07-31 | Discharge: 2023-07-31 | Disposition: A | Discharge status: Home / Self Care | Source: Ambulatory Visit | Attending: Family Medicine | Admitting: Family Medicine

## 2023-07-31 ENCOUNTER — Ambulatory Visit (INDEPENDENT_AMBULATORY_CARE_PROVIDER_SITE_OTHER): Admitting: Diagnostic Neuroimaging

## 2023-07-31 ENCOUNTER — Encounter: Payer: Self-pay | Admitting: Diagnostic Neuroimaging

## 2023-07-31 VITALS — BP 101/75 | HR 75 | Ht 67.0 in | Wt 138.6 lb

## 2023-07-31 DIAGNOSIS — R202 Paresthesia of skin: Secondary | ICD-10-CM | POA: Diagnosis not present

## 2023-07-31 DIAGNOSIS — R200 Anesthesia of skin: Secondary | ICD-10-CM | POA: Diagnosis not present

## 2023-07-31 DIAGNOSIS — I7300 Raynaud's syndrome without gangrene: Secondary | ICD-10-CM | POA: Diagnosis not present

## 2023-07-31 DIAGNOSIS — R911 Solitary pulmonary nodule: Secondary | ICD-10-CM

## 2023-07-31 NOTE — Patient Instructions (Signed)
  NUMBNESS / TINGLING (pins / needles, toes)  - check neuropathy labs  - slow progression; monitor; consider EMG/NCS if sxs significantly worsen   Painful neuropathy treatment options: -duloxetine 30-60mg  daily, amitriptyline 25-50mg  at bedtime, gabapentin 100-300mg  three times a day, pregabalin 75-150mg  twice a day -capsaicin cream, lidocaine patch / cream, alpha-lipoic acid 600mg  daily

## 2023-07-31 NOTE — Progress Notes (Signed)
 [facility]  PATIENT: Olivia Johnson DOB: 1966-02-04  REFERRING CLINICIAN: Alphonsa Glendia LABOR, MD HISTORY FROM: patient  REASON FOR VISIT: new consult   HISTORICAL  CHIEF COMPLAINT:  Chief Complaint  Patient presents with   RM7/IDOPATHIC PERIPHERAL NEUROPATHY    Pt is here Alone. Pt states her neuropathy is in her toes and in her legs. Pt states that she is having numbness in her toes and legs. Pt states she has raynaud's disease.     HISTORY OF PRESENT ILLNESS:   58 year old female here for evaluation of numbness tingling sensation in the toes up to the shins over the past 5 years.  Symptoms are gradually changing over time.  No significant pain.    Has history of Raynaud's phenomenon since age 64 years old with associated with color change in the fingertips and toes, triggered by cold environment, sometimes associated with tingling sensations.  Current symptoms feels different than related to Raynaud's.   REVIEW OF SYSTEMS: Full 14 system review of systems performed and negative with exception of: as per HPI.  ALLERGIES: Allergies  Allergen Reactions   Oxycodone  Other (See Comments)    nightmares    HOME MEDICATIONS: Outpatient Medications Prior to Visit  Medication Sig Dispense Refill   rosuvastatin  (CRESTOR ) 10 MG tablet Take 1 tablet (10 mg total) by mouth daily. 90 tablet 1   No facility-administered medications prior to visit.    PAST MEDICAL HISTORY: Past Medical History:  Diagnosis Date   Elevated coronary artery calcium  score    Hyperlipidemia    Idiopathic peripheral neuropathy    Raynaud's syndrome 03/17/2019   Has had this since teenager years mainly in the hands some in the toes   Tinnitus of both ears     PAST SURGICAL HISTORY: Past Surgical History:  Procedure Laterality Date   COLONOSCOPY N/A 09/17/2016   Procedure: COLONOSCOPY;  Surgeon: Shaaron Lamar HERO, MD;  [facility];  Service: Endoscopy;  Laterality: N/A;   2:30 pm    FAMILY HISTORY: Family History  Problem Relation Age of Onset   Transient ischemic attack Mother    Hyperlipidemia Father     SOCIAL HISTORY: Social History   Socioeconomic History   Marital status: Married    Spouse name: Not on file   Number of children: Not on file   Years of education: Not on file   Highest education level: Some college, no degree  Occupational History   Not on file  Tobacco Use   Smoking status: Never   Smokeless tobacco: Never  Vaping Use   Vaping status: Never Used  Substance and Sexual Activity   Alcohol use: Yes    Comment: occasional   Drug use: No   Sexual activity: Yes    Birth control/protection: Post-menopausal  Other Topics Concern   Not on file  Social History Narrative   Lives at home with with husband.     Social Drivers of Corporate investment banker Strain: Low Risk  (02/07/2023)   Overall Financial Resource Strain (CARDIA)    Difficulty of Paying Living Expenses: Not hard at all  Food Insecurity: No Food Insecurity (02/07/2023)   Hunger Vital Sign    Worried About Running Out of Food in the Last Year: Never true    Ran Out of Food in the Last Year: Never true  Transportation Needs: No Transportation Needs (02/07/2023)   PRAPARE - Administrator, Civil Service (Medical): No    Lack  of Transportation (Non-Medical): No  Physical Activity: Sufficiently Active (02/07/2023)   Exercise Vital Sign    Days of Exercise per Week: 3 days    Minutes of Exercise per Session: 60 min  Stress: No Stress Concern Present (02/07/2023)   [employer] of Occupational Health - Occupational Stress Questionnaire    Feeling of Stress : Only a little  Social Connections: Socially Integrated (02/07/2023)   Social Connection and Isolation Panel    Frequency of Communication with Friends and Family: Twice a week    Frequency of Social Gatherings with Friends and Family: Twice a week    Attends Religious Services: 1 to 4 times per  year    Active Member of [employer] or Organizations: Yes    Attends Engineer, structural: More than 4 times per year    Marital Status: Married  Catering manager Violence: Not on file     PHYSICAL EXAM  GENERAL EXAM/CONSTITUTIONAL: Vitals:  Vitals:   07/31/23 0909  BP: 101/75  Pulse: 75  SpO2: 99%  Weight: 138 lb 9.6 oz (62.9 kg)  Height: 5' 7 (1.702 m)   Body mass index is 21.71 kg/m. Wt Readings from Last 3 Encounters:  07/31/23 138 lb 9.6 oz (62.9 kg)  07/02/23 139 lb (63 kg)  06/30/23 135 lb (61.2 kg)   Patient is in no distress; well developed, nourished and groomed; neck is supple  CARDIOVASCULAR: Examination of carotid arteries is normal; no carotid bruits Regular rate and rhythm, no murmurs Examination of peripheral vascular system by observation and palpation is normal  EYES: Ophthalmoscopic exam of optic discs and posterior segments is normal; no papilledema or hemorrhages No results found.  MUSCULOSKELETAL: Gait, strength, tone, movements noted in Neurologic exam below  NEUROLOGIC: MENTAL STATUS:      No data to display         awake, alert, oriented to person, place and time recent and remote memory intact normal attention and concentration language fluent, comprehension intact, naming intact fund of knowledge appropriate  CRANIAL NERVE:  2nd - no papilledema on fundoscopic exam 2nd, 3rd, 4th, 6th - pupils equal and reactive to light, visual fields full to confrontation, extraocular muscles intact, no nystagmus 5th - facial sensation symmetric 7th - facial strength symmetric 8th - hearing intact 9th - palate elevates symmetrically, uvula midline 11th - shoulder shrug symmetric 12th - tongue protrusion midline  MOTOR:  normal bulk and tone, full strength in the BUE, BLE  SENSORY:  normal and symmetric to light touch, temperature, vibration; SLIGHTLY DECR IN BOTTOM OF FEET / TOES  COORDINATION:  finger-nose-finger, fine finger  movements normal  REFLEXES:  deep tendon reflexes 1+ and symmetric  GAIT/STATION:  narrow based gait     DIAGNOSTIC DATA (LABS, IMAGING, TESTING) - I reviewed patient records, labs, notes, testing and imaging myself where available.  Lab Results  Component Value Date   WBC 6.3 03/18/2019   HGB 13.4 03/18/2019   HCT 39.9 03/18/2019   MCV 86 03/18/2019   PLT 295 03/18/2019      Component Value Date/Time   NA 144 02/11/2023 1002   K 4.6 02/11/2023 1002   CL 104 02/11/2023 1002   CO2 23 02/11/2023 1002   GLUCOSE 77 02/11/2023 1002   GLUCOSE 95 01/24/2014 1706   BUN 13 02/11/2023 1002   CREATININE 0.70 02/11/2023 1002   CREATININE 0.75 01/24/2014 1706   CALCIUM  9.6 02/11/2023 1002   PROT 7.0 02/11/2023 1002   ALBUMIN 5.0 (H) 02/11/2023  1002   AST 18 02/11/2023 1002   ALT 17 02/11/2023 1002   ALKPHOS 81 02/11/2023 1002   BILITOT 0.3 02/11/2023 1002   GFRNONAA 91 03/18/2019 1051   GFRAA 105 03/18/2019 1051   Lab Results  Component Value Date   CHOL 193 06/30/2023   HDL 61 06/30/2023   LDLCALC 113 (H) 06/30/2023   TRIG 108 06/30/2023   CHOLHDL 3.2 06/30/2023   Lab Results  Component Value Date   HGBA1C 5.7 (H) 02/11/2023   Lab Results  Component Value Date   VITAMINB12 468 02/11/2023   Lab Results  Component Value Date   TSH 2.270 03/18/2019       ASSESSMENT AND PLAN  59 y.o. year old female here with:   Dx:  1. Numbness and tingling   2. Raynaud's phenomenon without gangrene     PLAN:  NUMBNESS / TINGLING (pins / needles, toes)  - check neuropathy labs  - slow progression; monitor; consider EMG/NCS if sxs significantly worsen   Painful neuropathy treatment options: -duloxetine 30-60mg  daily, amitriptyline 25-50mg  at bedtime, gabapentin 100-300mg  three times a day, pregabalin 75-150mg  twice a day -capsaicin cream, lidocaine patch / cream, alpha-lipoic acid 600mg  daily  Orders Placed This Encounter  Procedures   CBC with diff   SPEP  with IFE   ANA w/Reflex   SSA, SSB   ANCA Profile   Return for pending if symptoms worsen or fail to improve, pending test results.    EDUARD FABIENE HANLON, MD 07/31/2023, 2:12 PM Certified in Neurology, Neurophysiology and Neuroimaging  [facility] [address], [address] [place], [place] [address] [phone]

## 2023-08-04 LAB — MULTIPLE MYELOMA PANEL, SERUM
Albumin SerPl Elph-Mcnc: 4.1 g/dL (ref 2.9–4.4)
Albumin/Glob SerPl: 1.5 (ref 0.7–1.7)
Alpha 1: 0.2 g/dL (ref 0.0–0.4)
Alpha2 Glob SerPl Elph-Mcnc: 0.8 g/dL (ref 0.4–1.0)
B-Globulin SerPl Elph-Mcnc: 1 g/dL (ref 0.7–1.3)
Gamma Glob SerPl Elph-Mcnc: 0.7 g/dL (ref 0.4–1.8)
Globulin, Total: 2.8 g/dL (ref 2.2–3.9)
IgA/Immunoglobulin A, Serum: 108 mg/dL (ref 87–352)
IgG (Immunoglobin G), Serum: 828 mg/dL (ref 586–1602)
IgM (Immunoglobulin M), Srm: 30 mg/dL (ref 26–217)
Total Protein: 6.9 g/dL (ref 6.0–8.5)

## 2023-08-04 LAB — CBC WITH DIFFERENTIAL/PLATELET
Basophils Absolute: 0.1 10*3/uL (ref 0.0–0.2)
Basos: 1 %
EOS (ABSOLUTE): 0.2 10*3/uL (ref 0.0–0.4)
Eos: 3 %
Hematocrit: 44.4 % (ref 34.0–46.6)
Hemoglobin: 13.8 g/dL (ref 11.1–15.9)
Immature Grans (Abs): 0 10*3/uL (ref 0.0–0.1)
Immature Granulocytes: 0 %
Lymphocytes Absolute: 1.8 10*3/uL (ref 0.7–3.1)
Lymphs: 23 %
MCH: 28.2 pg (ref 26.6–33.0)
MCHC: 31.1 g/dL — ABNORMAL LOW (ref 31.5–35.7)
MCV: 91 fL (ref 79–97)
Monocytes Absolute: 0.6 10*3/uL (ref 0.1–0.9)
Monocytes: 7 %
Neutrophils Absolute: 5.4 10*3/uL (ref 1.4–7.0)
Neutrophils: 66 %
Platelets: 330 10*3/uL (ref 150–450)
RBC: 4.9 x10E6/uL (ref 3.77–5.28)
RDW: 13.1 % (ref 11.7–15.4)
WBC: 8.1 10*3/uL (ref 3.4–10.8)

## 2023-08-04 LAB — ANCA PROFILE
Anti-MPO Antibodies: <0.2 U (ref 0.0–0.9)
Anti-PR3 Antibodies: <0.2 U (ref 0.0–0.9)
Atypical pANCA: <1:20 {titer}
C-ANCA: <1:20 {titer}
P-ANCA: <1:20 {titer}

## 2023-08-04 LAB — ENA+DNA/DS+ANTICH+CENTRO+FA...
Anti JO-1: <0.2 AI (ref 0.0–0.9)
Antiribosomal P Antibodies: <0.2 AI (ref 0.0–0.9)
Centromere Ab Screen: <0.2 AI (ref 0.0–0.9)
Chromatin Ab SerPl-aCnc: <0.2 AI (ref 0.0–0.9)
ENA RNP Ab: 0.2 AI (ref 0.0–0.9)
ENA SM Ab Ser-aCnc: <0.2 AI (ref 0.0–0.9)
Scleroderma (Scl-70) (ENA) Antibody, IgG: <0.2 AI (ref 0.0–0.9)
Smith/RNP Antibodies: <0.2 AI (ref 0.0–0.9)
Speckled Pattern: 1:80 {titer}
dsDNA Ab: <1 [IU]/mL (ref 0–9)

## 2023-08-04 LAB — ANA W/REFLEX: ANA Titer 1: POSITIVE — AB

## 2023-08-04 LAB — SJOGREN'S SYNDROME ANTIBODS(SSA + SSB)
ENA SSA (RO) Ab: 0.4 AI (ref 0.0–0.9)
ENA SSB (LA) Ab: <0.2 AI (ref 0.0–0.9)

## 2023-08-06 ENCOUNTER — Ambulatory Visit: Payer: Self-pay | Admitting: Neurology

## 2023-08-12 ENCOUNTER — Ambulatory Visit: Payer: Self-pay | Admitting: Family Medicine

## 2023-08-13 ENCOUNTER — Telehealth: Payer: Self-pay | Admitting: Family Medicine

## 2023-08-13 ENCOUNTER — Encounter: Payer: Self-pay | Admitting: Family Medicine

## 2023-08-13 NOTE — Telephone Encounter (Signed)
 Nurses Please do CT scan of the lungs due to pulmonary nodules in 18 months that would actually be December 2026 please put this into the reminder file Diagnosis pulmonary nodules

## 2023-08-21 ENCOUNTER — Encounter (HOSPITAL_COMMUNITY): Payer: Self-pay | Admitting: *Deleted

## 2023-08-31 ENCOUNTER — Ambulatory Visit (HOSPITAL_COMMUNITY)
Admission: RE | Admit: 2023-08-31 | Discharge: 2023-08-31 | Disposition: A | Discharge status: Home / Self Care | Source: Ambulatory Visit | Attending: Cardiology | Admitting: Cardiology

## 2023-08-31 DIAGNOSIS — R931 Abnormal findings on diagnostic imaging of heart and coronary circulation: Secondary | ICD-10-CM | POA: Insufficient documentation

## 2023-08-31 DIAGNOSIS — E785 Hyperlipidemia, unspecified: Secondary | ICD-10-CM | POA: Insufficient documentation

## 2023-08-31 LAB — EXERCISE TOLERANCE TEST
Angina Index: 0
Duke Treadmill Score: 9
Estimated workload: 10.1
Exercise duration (min): 9 min
MPHR: 161 {beats}/min
Peak HR: 164 {beats}/min
Percent HR: 102 %
RPE: 18
Rest HR: 94 {beats}/min
ST Depression (mm): 0 mm

## 2023-09-02 ENCOUNTER — Ambulatory Visit: Payer: Self-pay | Admitting: Cardiology

## 2023-09-11 ENCOUNTER — Telehealth: Payer: Self-pay | Admitting: Family Medicine

## 2023-09-11 NOTE — Telephone Encounter (Signed)
 Autumn I received notification from [employer] Please have the PA team look into this  This patient had a CT cardiac calcium  scan in December 2024 which identified pulmonary nodules that recommended a follow-up scan in 6 to 12 months later Patient had follow-up scan in July  I am not sure if this rejection is for that particular CAT scan or inadvertently was a extra CAT scan order put in and the insurance company is rejecting that  Nonetheless-I want to make sure that what was recently completed in July is covered by her insurance or does not need a letter of appeal or a phone contact.-I truly do not want to waste any of our time or energy doing a appeal unless it is needed please see the form please see the previous test please have the PA team take this on  Let me know if I need to be reinvolved thank you  FYI-it has been put into the reminder file for her to have a follow-up CT scan in January 2027 with a office visit December 2026-thanks-Dr. Fayth Trefry  Sorry for the hassle

## 2023-09-11 NOTE — Telephone Encounter (Signed)
 Nurses Please put into reminder file patient would be due for a follow-up CT scan of the chest in January 2027 What I would recommend is the following Put into the reminder file for November 2026 to connect with the patient to get her scheduled for an office visit either late December 2026 or January 2027 in order to do the proper documentation so that a CT scan could be ordered and be covered by her insurance company thank you

## 2023-09-15 NOTE — Telephone Encounter (Addendum)
 Patient had test 07/2023- it was denied at [facility] and Covered only at [facility] and she had it there

## 2023-09-15 NOTE — Telephone Encounter (Signed)
 Sounds good - thank you for clarifying

## 2023-12-28 ENCOUNTER — Telehealth: Payer: Self-pay | Admitting: Family Medicine

## 2023-12-28 ENCOUNTER — Other Ambulatory Visit: Payer: Self-pay | Admitting: Nurse Practitioner

## 2023-12-28 MED ORDER — ROSUVASTATIN CALCIUM 10 MG PO TABS: 10.0000 mg | ORAL_TABLET | Freq: Every day | ORAL | 1 refills | Status: AC

## 2023-12-28 NOTE — Telephone Encounter (Signed)
 Refill on  rosuvastatin  (CRESTOR ) 10 MG tablet  last filled 02/16/23 for 90 day supply  Patient last seen 04/09/23
